# Patient Record
Sex: Female | Born: 1935 | Race: White | Hispanic: No | Smoking: Never smoker
Health system: Southern US, Community
[De-identification: ages and names within clinical notes are randomized; demographics above are authoritative.]

## PROBLEM LIST (undated history)

## (undated) DIAGNOSIS — K219 Gastro-esophageal reflux disease without esophagitis: Secondary | ICD-10-CM

## (undated) DIAGNOSIS — E039 Hypothyroidism, unspecified: Secondary | ICD-10-CM

## (undated) DIAGNOSIS — H548 Legal blindness, as defined in USA: Secondary | ICD-10-CM

## (undated) DIAGNOSIS — H3530 Unspecified macular degeneration: Secondary | ICD-10-CM

## (undated) DIAGNOSIS — C801 Malignant (primary) neoplasm, unspecified: Secondary | ICD-10-CM

## (undated) DIAGNOSIS — M1990 Unspecified osteoarthritis, unspecified site: Secondary | ICD-10-CM

## (undated) DIAGNOSIS — K589 Irritable bowel syndrome without diarrhea: Secondary | ICD-10-CM

## (undated) DIAGNOSIS — E079 Disorder of thyroid, unspecified: Secondary | ICD-10-CM

## (undated) DIAGNOSIS — G250 Essential tremor: Secondary | ICD-10-CM

## (undated) DIAGNOSIS — E7800 Pure hypercholesterolemia, unspecified: Secondary | ICD-10-CM

## (undated) DIAGNOSIS — Z5189 Encounter for other specified aftercare: Secondary | ICD-10-CM

## (undated) DIAGNOSIS — E785 Hyperlipidemia, unspecified: Secondary | ICD-10-CM

## (undated) HISTORY — PX: ABDOMINAL HYSTERECTOMY: SHX81

## (undated) HISTORY — PX: EYE SURGERY: SHX253

## (undated) HISTORY — PX: OTHER SURGICAL HISTORY: SHX169

## (undated) HISTORY — PX: JOINT REPLACEMENT: SHX530

---

## 2017-05-03 ENCOUNTER — Encounter: Payer: Self-pay | Admitting: Emergency Medicine

## 2017-05-03 ENCOUNTER — Emergency Department
Admission: EM | Admit: 2017-05-03 | Discharge: 2017-05-03 | Disposition: A | Discharge status: Home / Self Care | Payer: Medicare Other | Attending: Emergency Medicine | Admitting: Emergency Medicine

## 2017-05-03 DIAGNOSIS — E079 Disorder of thyroid, unspecified: Secondary | ICD-10-CM | POA: Insufficient documentation

## 2017-05-03 DIAGNOSIS — K529 Noninfective gastroenteritis and colitis, unspecified: Secondary | ICD-10-CM | POA: Diagnosis not present

## 2017-05-03 DIAGNOSIS — R1110 Vomiting, unspecified: Secondary | ICD-10-CM | POA: Diagnosis present

## 2017-05-03 DIAGNOSIS — C44310 Basal cell carcinoma of skin of unspecified parts of face: Secondary | ICD-10-CM | POA: Diagnosis not present

## 2017-05-03 DIAGNOSIS — R1032 Left lower quadrant pain: Secondary | ICD-10-CM | POA: Insufficient documentation

## 2017-05-03 HISTORY — DX: Pure hypercholesterolemia, unspecified: E78.00

## 2017-05-03 HISTORY — DX: Malignant (primary) neoplasm, unspecified: C80.1

## 2017-05-03 HISTORY — DX: Disorder of thyroid, unspecified: E07.9

## 2017-05-03 HISTORY — DX: Essential tremor: G25.0

## 2017-05-03 HISTORY — DX: Legal blindness, as defined in USA: H54.8

## 2017-05-03 HISTORY — DX: Unspecified macular degeneration: H35.30

## 2017-05-03 LAB — CBC
HCT: 37.8 % (ref 35.0–47.0)
Hemoglobin: 13 g/dL (ref 12.0–16.0)
MCH: 32.1 pg (ref 26.0–34.0)
MCHC: 34.4 g/dL (ref 32.0–36.0)
MCV: 93.5 fL (ref 80.0–100.0)
PLATELETS: 178 10*3/uL (ref 150–440)
RBC: 4.04 MIL/uL (ref 3.80–5.20)
RDW: 12.7 % (ref 11.5–14.5)
WBC: 3.8 10*3/uL (ref 3.6–11.0)

## 2017-05-03 LAB — COMPREHENSIVE METABOLIC PANEL
ALK PHOS: 55 U/L (ref 38–126)
ALT: 16 U/L (ref 14–54)
AST: 24 U/L (ref 15–41)
Albumin: 4.6 g/dL (ref 3.5–5.0)
Anion gap: 8 (ref 5–15)
BILIRUBIN TOTAL: 0.9 mg/dL (ref 0.3–1.2)
BUN: 19 mg/dL (ref 6–20)
CALCIUM: 8.8 mg/dL — AB (ref 8.9–10.3)
CO2: 26 mmol/L (ref 22–32)
CREATININE: 1.16 mg/dL — AB (ref 0.44–1.00)
Chloride: 102 mmol/L (ref 101–111)
GFR calc Af Amer: 49 mL/min — ABNORMAL LOW (ref >60)
GFR, EST NON AFRICAN AMERICAN: 43 mL/min — AB (ref >60)
Glucose, Bld: 96 mg/dL (ref 65–99)
Potassium: 3.9 mmol/L (ref 3.5–5.1)
Sodium: 136 mmol/L (ref 135–145)
Total Protein: 7.4 g/dL (ref 6.5–8.1)

## 2017-05-03 LAB — LIPASE, BLOOD: Lipase: 22 U/L (ref 11–51)

## 2017-05-03 MED ORDER — DIPHENOXYLATE-ATROPINE 2.5-0.025 MG PO TABS: 1.0000 | ORAL_TABLET | Freq: Four times a day (QID) | ORAL | 1 refills | Status: AC | PRN

## 2017-05-03 MED ORDER — ONDANSETRON 4 MG PO TBDP: 4.0000 mg | ORAL_TABLET | Freq: Three times a day (TID) | ORAL | 0 refills | Status: DC | PRN

## 2017-05-03 MED ORDER — ONDANSETRON 4 MG PO TBDP
4.0000 mg | ORAL_TABLET | Freq: Once | ORAL | Status: AC
  Administered 2017-05-03: 4 mg via ORAL
  Filled 2017-05-03: qty 1

## 2017-05-03 NOTE — ED Provider Notes (Signed)
[facility] Emergency Department Provider Note       Time seen: ----------------------------------------- 4:11 PM on 05/03/2017 -----------------------------------------     I have reviewed the triage vital signs and the nursing notes.   HISTORY   Chief Complaint Emesis; Diarrhea; and Abdominal Pain (LLQ)    HPI Carla Warner is a 83 y.o. female who presents to the ED for vomiting and diarrhea yesterday and today. Patient is currently able to tolerate some fluids. Yesterday she states she had a fever but no fever today. She denies chest pain, shortness of breath, other complaints at this time. She denies any focal abdominal tenderness. She does have history of IBS which feels much differently than this.   Past Medical History:  Diagnosis Date  . Cancer ([place])    basal cell ca-- face  . Essential tremor   . High cholesterol   . Legally blind   . Macular degeneration   . Thyroid disease     There are no active problems to display for this patient.   Past Surgical History:  Procedure Laterality Date  . ABDOMINAL HYSTERECTOMY    . basal cell removed    . JOINT REPLACEMENT Right     Allergies Patient has no known allergies.  Social History Social History  Substance Use Topics  . Smoking status: Never Smoker  . Smokeless tobacco: Never Used  . Alcohol use No    Review of Systems Constitutional: Negative for fever. Cardiovascular: Negative for chest pain. Respiratory: Negative for shortness of breath. Gastrointestinal: Negative for abdominal pain, Positive for vomiting and diarrhea Genitourinary: Negative for dysuria. Musculoskeletal: Negative for back pain. Skin: Negative for rash. Neurological: Negative for headaches, focal weakness or numbness.  All systems negative/normal/unremarkable except as stated in the HPI  ____________________________________________   PHYSICAL EXAM:  VITAL SIGNS: ED Triage Vitals  Enc Vitals  Group     BP 05/03/17 1255 (!) 120/46     Pulse Rate 05/03/17 1255 70     Resp 05/03/17 1255 16     Temp 05/03/17 1255 98.5 F (36.9 C)     Temp Source 05/03/17 1255 Oral     SpO2 05/03/17 1255 98 %     Weight 05/03/17 1251 135 lb (61.2 kg)     Height 05/03/17 1251 5\' 6"  (1.676 m)     Head Circumference --      Peak Flow --      Pain Score 05/03/17 1251 5     Pain Loc --      Pain Edu? --      Excl. in [place]? --     Constitutional: Alert and oriented. Well appearing and in no distress. Eyes: Conjunctivae are normal. Normal extraocular movements. ENT   Head: Normocephalic and atraumatic.   Nose: No congestion/rhinnorhea.   Mouth/Throat: Mucous membranes are moist.   Neck: No stridor. Cardiovascular: Normal rate, regular rhythm. No murmurs, rubs, or gallops. Respiratory: Normal respiratory effort without tachypnea nor retractions. Breath sounds are clear and equal bilaterally. No wheezes/rales/rhonchi. Gastrointestinal: Soft and nontender. Normal bowel sounds Musculoskeletal: Nontender with normal range of motion in extremities. No lower extremity tenderness nor edema. Neurologic:  Normal speech and language. No gross focal neurologic deficits are appreciated.  Skin:  Skin is warm, dry and intact. No rash noted. Psychiatric: Mood and affect are normal. Speech and behavior are normal.  ____________________________________________  ED COURSE:  Pertinent labs & imaging results that were available during my care of the patient were reviewed by  me and considered in my medical decision making (see chart for details). Patient presents for symptoms of gastroenteritis, we will assess with labs and imaging as indicated.   Procedures ____________________________________________   LABS (pertinent positives/negatives)  Labs Reviewed  COMPREHENSIVE METABOLIC PANEL - Abnormal; Notable for the following:       Result Value   Creatinine, Ser 1.16 (*)    Calcium 8.8 (*)    GFR  calc non Af Amer 43 (*)    GFR calc Af Amer 49 (*)    All other components within normal limits  LIPASE, BLOOD  CBC  URINALYSIS, COMPLETE (UACMP) WITH MICROSCOPIC   ____________________________________________  FINAL ASSESSMENT AND PLAN  Gastroenteritis  Plan: Patient's labs were dictated above. Patient had presented for vomiting and diarrhea and appears much younger than her stated age. Her labs reveal mild dehydration but she was able to drink well here. I will prescribe antiemetics and antidiarrheal agents and should her symptoms recur. She is stable for outpatient follow-up.   Earleen Newport, MD   Note: This note was generated in part or whole with voice recognition software. Voice recognition is usually quite accurate but there are transcription errors that can and very often do occur. I apologize for any typographical errors that were not detected and corrected.     Earleen Newport, MD 05/03/17 [phone]

## 2017-05-03 NOTE — ED Triage Notes (Signed)
Arrives with c/o vomiting and diarrhea yesterday and today.  Today able to tolerate fluids.  Yesterday patient had low grade fever, no fever today.

## 2017-08-05 ENCOUNTER — Emergency Department
Admission: EM | Admit: 2017-08-05 | Discharge: 2017-08-05 | Disposition: A | Discharge status: Home / Self Care | Payer: Medicare Other | Attending: Emergency Medicine | Admitting: Emergency Medicine

## 2017-08-05 DIAGNOSIS — R197 Diarrhea, unspecified: Secondary | ICD-10-CM | POA: Insufficient documentation

## 2017-08-05 DIAGNOSIS — Z85828 Personal history of other malignant neoplasm of skin: Secondary | ICD-10-CM | POA: Insufficient documentation

## 2017-08-05 DIAGNOSIS — R112 Nausea with vomiting, unspecified: Secondary | ICD-10-CM

## 2017-08-05 DIAGNOSIS — I10 Essential (primary) hypertension: Secondary | ICD-10-CM | POA: Insufficient documentation

## 2017-08-05 LAB — COMPREHENSIVE METABOLIC PANEL
ALBUMIN: 5 g/dL (ref 3.5–5.0)
ALT: 15 U/L (ref 14–54)
ANION GAP: 10 (ref 5–15)
AST: 22 U/L (ref 15–41)
Alkaline Phosphatase: 48 U/L (ref 38–126)
BUN: 26 mg/dL — ABNORMAL HIGH (ref 6–20)
CHLORIDE: 106 mmol/L (ref 101–111)
CO2: 24 mmol/L (ref 22–32)
Calcium: 9.4 mg/dL (ref 8.9–10.3)
Creatinine, Ser: 1.16 mg/dL — ABNORMAL HIGH (ref 0.44–1.00)
GFR calc Af Amer: 49 mL/min — ABNORMAL LOW (ref >60)
GFR calc non Af Amer: 43 mL/min — ABNORMAL LOW (ref >60)
GLUCOSE: 150 mg/dL — AB (ref 65–99)
Potassium: 3.9 mmol/L (ref 3.5–5.1)
SODIUM: 140 mmol/L (ref 135–145)
Total Bilirubin: 0.9 mg/dL (ref 0.3–1.2)
Total Protein: 7.9 g/dL (ref 6.5–8.1)

## 2017-08-05 LAB — URINALYSIS, COMPLETE (UACMP) WITH MICROSCOPIC
BACTERIA UA: NONE SEEN
BILIRUBIN URINE: NEGATIVE
Glucose, UA: NEGATIVE mg/dL
Hgb urine dipstick: NEGATIVE
KETONES UR: 5 mg/dL — AB
Leukocytes, UA: NEGATIVE
Nitrite: NEGATIVE
PH: 5 (ref 5.0–8.0)
PROTEIN: NEGATIVE mg/dL
RBC / HPF: NONE SEEN RBC/hpf (ref 0–5)
Specific Gravity, Urine: 1.016 (ref 1.005–1.030)

## 2017-08-05 LAB — CBC
HEMATOCRIT: 40 % (ref 35.0–47.0)
HEMOGLOBIN: 13.6 g/dL (ref 12.0–16.0)
MCH: 32 pg (ref 26.0–34.0)
MCHC: 34.1 g/dL (ref 32.0–36.0)
MCV: 93.9 fL (ref 80.0–100.0)
Platelets: 193 10*3/uL (ref 150–440)
RBC: 4.26 MIL/uL (ref 3.80–5.20)
RDW: 12.6 % (ref 11.5–14.5)
WBC: 8.2 10*3/uL (ref 3.6–11.0)

## 2017-08-05 LAB — TROPONIN I: Troponin I: <0.03 ng/mL (ref <0.03)

## 2017-08-05 LAB — LIPASE, BLOOD: LIPASE: 31 U/L (ref 11–51)

## 2017-08-05 MED ORDER — SODIUM CHLORIDE 0.9 % IV BOLUS (SEPSIS)
1000.0000 mL | Freq: Once | INTRAVENOUS | Status: AC
  Administered 2017-08-05: 1000 mL via INTRAVENOUS

## 2017-08-05 MED ORDER — ONDANSETRON HCL 4 MG/2ML IJ SOLN
4.0000 mg | Freq: Once | INTRAMUSCULAR | Status: AC | PRN
  Administered 2017-08-05: 4 mg via INTRAVENOUS
  Filled 2017-08-05: qty 2

## 2017-08-05 MED ORDER — ONDANSETRON 4 MG PO TBDP: 4.0000 mg | ORAL_TABLET | Freq: Three times a day (TID) | ORAL | 0 refills | Status: DC | PRN

## 2017-08-05 NOTE — ED Triage Notes (Addendum)
Pt arrives to ED via [facility] from home with c/o N/V/D that started around 130 am. Pt denies CP, abdominal pain, or SHOB. Pt with h/x of IBS, reports 4-5 episodes of non-bloody emesis and 6-7 episodes of diarrhea. Pt had facial surgery on Wednesday at [facility] to remove cancerous tumor and scheduled for follow-up appt this AM. Family worried about dehydration and seeks to have pt evaluated. EMS vitals: 130/56 BP, 82 HR, 99% O2 on RA. Pt is A&O, in NAD; RR even, regular, and unlabored; skin color/temp is WNL. EMS reports pt took 2 doses of prescribed Hydrocodone/APAP: (1) at 530pm and (1) at 11pm.

## 2017-08-05 NOTE — ED Provider Notes (Signed)
Care signed over from Dr. Beather Arbour pending results of urinalysis and stool studies. Patient's urinalysis is negative. She has been unable to give a stool sample. She feels like this is an exacerbation of her chronic IBS. She is able to eat and drink. She would like to go home.  Strict return precautions given.   Darel Hong, MD 08/05/17 [phone]

## 2017-08-05 NOTE — Discharge Instructions (Signed)
Please follow-up with your primary care physician as needed and return to the emergency department for any concerns whatsoever.  It was a pleasure to take care of you today, and thank you for coming to our emergency department.  If you have any questions or concerns before leaving please ask the nurse to grab me and I'm more than happy to go through your aftercare instructions again.  If you were prescribed any opioid pain medication today such as Norco, Vicodin, Percocet, morphine, hydrocodone, or oxycodone please make sure you do not drive when you are taking this medication as it can alter your ability to drive safely.  If you have any concerns once you are home that you are not improving or are in fact getting worse before you can make it to your follow-up appointment, please do not hesitate to call [phone] and come back for further evaluation.  Darel Hong, MD  Results for orders placed or performed during the hospital encounter of 08/05/17  Lipase, blood  Result Value Ref Range   Lipase 31 11 - 51 U/L  Comprehensive metabolic panel  Result Value Ref Range   Sodium 140 135 - 145 mmol/L   Potassium 3.9 3.5 - 5.1 mmol/L   Chloride 106 101 - 111 mmol/L   CO2 24 22 - 32 mmol/L   Glucose, Bld 150 (H) 65 - 99 mg/dL   BUN 26 (H) 6 - 20 mg/dL   Creatinine, Ser 1.16 (H) 0.44 - 1.00 mg/dL   Calcium 9.4 8.9 - 10.3 mg/dL   Total Protein 7.9 6.5 - 8.1 g/dL   Albumin 5.0 3.5 - 5.0 g/dL   AST 22 15 - 41 U/L   ALT 15 14 - 54 U/L   Alkaline Phosphatase 48 38 - 126 U/L   Total Bilirubin 0.9 0.3 - 1.2 mg/dL   GFR calc non Af Amer 43 (L) >60 mL/min   GFR calc Af Amer 49 (L) >60 mL/min   Anion gap 10 5 - 15  CBC  Result Value Ref Range   WBC 8.2 3.6 - 11.0 K/uL   RBC 4.26 3.80 - 5.20 MIL/uL   Hemoglobin 13.6 12.0 - 16.0 g/dL   HCT 40.0 35.0 - 47.0 %   MCV 93.9 80.0 - 100.0 fL   MCH 32.0 26.0 - 34.0 pg   MCHC 34.1 32.0 - 36.0 g/dL   RDW 12.6 11.5 - 14.5 %   Platelets 193 150 - 440 K/uL    Urinalysis, Complete w Microscopic  Result Value Ref Range   Color, Urine YELLOW (A) YELLOW   APPearance CLEAR (A) CLEAR   Specific Gravity, Urine 1.016 1.005 - 1.030   pH 5.0 5.0 - 8.0   Glucose, UA NEGATIVE NEGATIVE mg/dL   Hgb urine dipstick NEGATIVE NEGATIVE   Bilirubin Urine NEGATIVE NEGATIVE   Ketones, ur 5 (A) NEGATIVE mg/dL   Protein, ur NEGATIVE NEGATIVE mg/dL   Nitrite NEGATIVE NEGATIVE   Leukocytes, UA NEGATIVE NEGATIVE   RBC / HPF NONE SEEN 0 - 5 RBC/hpf   WBC, UA 0-5 0 - 5 WBC/hpf   Bacteria, UA NONE SEEN NONE SEEN   Squamous Epithelial / LPF 0-5 (A) NONE SEEN   Mucus PRESENT    Hyaline Casts, UA PRESENT   Troponin I  Result Value Ref Range   Troponin I <0.03 <0.03 ng/mL

## 2017-08-05 NOTE — ED Provider Notes (Signed)
[facility] Emergency Department Provider Note   ____________________________________________   First MD Initiated Contact with Patient 08/05/17 [phone]     (approximate)  I have reviewed the triage vital signs and the nursing notes.   HISTORY  Chief Complaint Nausea; Emesis; and Diarrhea    HPI Carla Warner is a 83 y.o. female brought to the ED from home via EMS with a chief complaint of nausea/vomiting/diarrhea.Patient had Mohs surgery to her nose and left ear at [facility] yesterday, discharged home on Vicodin. Took a dose at 5:30 PM and then again at 11 PM. Awoke at 1:30 AM with simultaneous vomiting and diarrhea. Patient has a history of IBS. Family concerned for dehydration. Also states the wound on her nose was saturated with blood while she was vomiting. States that seems to have resolved. Patient denies fever, chills, chest pain, shortness of breath, abdominal pain, dysuria. Denies recent travel, trauma or antibiotic use.   Past Medical History:  Diagnosis Date  . Cancer ([place])    basal cell ca-- face  . Essential tremor   . High cholesterol   . Legally blind   . Macular degeneration   . Thyroid disease     There are no active problems to display for this patient.   Past Surgical History:  Procedure Laterality Date  . ABDOMINAL HYSTERECTOMY    . basal cell removed    . JOINT REPLACEMENT Right     Prior to Admission medications   Medication Sig Start Date End Date Taking? Authorizing Provider  diphenoxylate-atropine (LOMOTIL) 2.5-0.025 MG tablet Take 1 tablet by mouth 4 (four) times daily as needed for diarrhea or loose stools. 05/03/17 05/03/18  Earleen Newport, MD  ondansetron (ZOFRAN ODT) 4 MG disintegrating tablet Take 1 tablet (4 mg total) by mouth every 8 (eight) hours as needed for nausea or vomiting. 05/03/17   Earleen Newport, MD    Allergies Patient has no known allergies.  No family history on file.  Social  History Social History  Substance Use Topics  . Smoking status: Never Smoker  . Smokeless tobacco: Never Used  . Alcohol use No    Review of Systems  Constitutional: No fever/chills. Eyes: No visual changes. ENT: No sore throat. Cardiovascular: Denies chest pain. Respiratory: Denies shortness of breath. Gastrointestinal: No abdominal pain.  Positive for nausea, vomiting and diarrhea.  No constipation. Genitourinary: Negative for dysuria. Musculoskeletal: Negative for back pain. Skin: Negative for rash. Neurological: Negative for headaches, focal weakness or numbness.   ____________________________________________   PHYSICAL EXAM:  VITAL SIGNS: ED Triage Vitals  Enc Vitals Group     BP 08/05/17 0428 117/68     Pulse Rate 08/05/17 0428 80     Resp 08/05/17 0428 17     Temp 08/05/17 0428 98 F (36.7 C)     Temp Source 08/05/17 0428 Oral     SpO2 08/05/17 0428 100 %     Weight 08/05/17 0428 136 lb (61.7 kg)     Height 08/05/17 0428 5\' 6"  (1.676 m)     Head Circumference --      Peak Flow --      Pain Score 08/05/17 0427 0     Pain Loc --      Pain Edu? --      Excl. in [place]? --     Constitutional: Alert and oriented. Well appearing and in no acute distress. Eyes: Conjunctivae are normal. PERRL. EOMI. Head: Atraumatic. Left ear dressing intact. Nose:  External dressing removed. There is no active bleeding through the tan adherent dressing which patient was instructed to keep on until next week.  Mouth/Throat: Mucous membranes are moist.  Oropharynx non-erythematous. Neck: No stridor.  Supple neck without meningismus. Cardiovascular: Normal rate, regular rhythm. Grossly normal heart sounds.  Good peripheral circulation. Respiratory: Normal respiratory effort.  No retractions. Lungs CTAB. Gastrointestinal: Soft and nontender to light and deep palpation. No distention. No abdominal bruits. No CVA tenderness. Musculoskeletal: No lower extremity tenderness nor edema.  No  joint effusions. Neurologic:  Normal speech and language. No gross focal neurologic deficits are appreciated. No gait instability. Skin:  Skin is warm, dry and intact. No rash noted. Psychiatric: Mood and affect are normal. Speech and behavior are normal.  ____________________________________________   LABS (all labs ordered are listed, but only abnormal results are displayed)  Labs Reviewed  COMPREHENSIVE METABOLIC PANEL - Abnormal; Notable for the following:       Result Value   Glucose, Bld 150 (*)    BUN 26 (*)    Creatinine, Ser 1.16 (*)    GFR calc non Af Amer 43 (*)    GFR calc Af Amer 49 (*)    All other components within normal limits  C DIFFICILE QUICK SCREEN W PCR REFLEX  GASTROINTESTINAL PANEL BY PCR, STOOL (REPLACES STOOL CULTURE)  LIPASE, BLOOD  CBC  TROPONIN I  URINALYSIS, COMPLETE (UACMP) WITH MICROSCOPIC   ____________________________________________  EKG  None ____________________________________________  RADIOLOGY  No results found.  ____________________________________________   PROCEDURES  Procedure(s) performed: None  Procedures  Critical Care performed: No  ____________________________________________   INITIAL IMPRESSION / ASSESSMENT AND PLAN / ED COURSE    83 year old female with IBS who presents with nausea/vomiting/diarrhea after Mohs surgery. Will obtain screening lab work and urinalysis, stool cultures. Initiate IV fluid resuscitation, antiemetic; will reassess.  Clinical Course as of Aug 05 702  Thu Aug 05, 2017  0702 Updated patient and family members of laboratory results. Nausea improved. Will attempt ice chips. Awaiting urine and stool specimens. Will administer second liter IV fluids. Care transferred to Dr. Mable Paris pending reassessment, results and disposition.  [JS]    Clinical Course User Index [JS] Paulette Blanch, MD     ____________________________________________   FINAL CLINICAL IMPRESSION(S) / ED  DIAGNOSES  Final diagnoses:  Nausea vomiting and diarrhea      NEW MEDICATIONS STARTED DURING THIS VISIT:  New Prescriptions   No medications on file     Note:  This document was prepared using Dragon voice recognition software and may include unintentional dictation errors.    Paulette Blanch, MD 08/05/17 [phone]

## 2017-10-28 ENCOUNTER — Other Ambulatory Visit: Payer: Self-pay | Admitting: Internal Medicine

## 2017-10-28 DIAGNOSIS — R1084 Generalized abdominal pain: Secondary | ICD-10-CM

## 2017-10-29 ENCOUNTER — Ambulatory Visit
Admission: RE | Admit: 2017-10-29 | Discharge: 2017-10-29 | Disposition: A | Discharge status: Home / Self Care | Payer: Medicare Other | Source: Ambulatory Visit | Attending: Internal Medicine | Admitting: Internal Medicine

## 2017-10-29 DIAGNOSIS — R195 Other fecal abnormalities: Secondary | ICD-10-CM | POA: Insufficient documentation

## 2017-10-29 DIAGNOSIS — Z9049 Acquired absence of other specified parts of digestive tract: Secondary | ICD-10-CM | POA: Diagnosis not present

## 2017-10-29 DIAGNOSIS — I700 Atherosclerosis of aorta: Secondary | ICD-10-CM | POA: Diagnosis not present

## 2017-10-29 DIAGNOSIS — D7389 Other diseases of spleen: Secondary | ICD-10-CM | POA: Diagnosis not present

## 2017-10-29 DIAGNOSIS — K7689 Other specified diseases of liver: Secondary | ICD-10-CM | POA: Diagnosis not present

## 2017-10-29 DIAGNOSIS — R1084 Generalized abdominal pain: Secondary | ICD-10-CM | POA: Diagnosis present

## 2017-10-29 DIAGNOSIS — K5790 Diverticulosis of intestine, part unspecified, without perforation or abscess without bleeding: Secondary | ICD-10-CM | POA: Insufficient documentation

## 2017-10-29 DIAGNOSIS — Z90710 Acquired absence of both cervix and uterus: Secondary | ICD-10-CM | POA: Insufficient documentation

## 2017-10-29 MED ORDER — IOPAMIDOL (ISOVUE-300) INJECTION 61%
100.0000 mL | Freq: Once | INTRAVENOUS | Status: AC | PRN
  Administered 2017-10-29: 100 mL via INTRAVENOUS

## 2018-01-30 ENCOUNTER — Other Ambulatory Visit
Admission: RE | Admit: 2018-01-30 | Discharge: 2018-01-30 | Disposition: A | Discharge status: Home / Self Care | Payer: Medicare Other | Source: Ambulatory Visit | Attending: Gastroenterology | Admitting: Gastroenterology

## 2018-01-30 DIAGNOSIS — R197 Diarrhea, unspecified: Secondary | ICD-10-CM | POA: Insufficient documentation

## 2018-01-30 LAB — C DIFFICILE QUICK SCREEN W PCR REFLEX
C DIFFICLE (CDIFF) ANTIGEN: NEGATIVE
C Diff interpretation: NOT DETECTED
C Diff toxin: NEGATIVE

## 2018-01-30 LAB — GASTROINTESTINAL PANEL BY PCR, STOOL (REPLACES STOOL CULTURE)

## 2018-02-08 ENCOUNTER — Encounter: Payer: Self-pay | Admitting: *Deleted

## 2018-02-09 ENCOUNTER — Ambulatory Visit
Admission: RE | Admit: 2018-02-09 | Discharge: 2018-02-09 | Disposition: A | Discharge status: Home / Self Care | Payer: Medicare Other | Source: Ambulatory Visit | Attending: Internal Medicine | Admitting: Internal Medicine

## 2018-02-09 ENCOUNTER — Encounter: Payer: Self-pay | Admitting: *Deleted

## 2018-02-09 ENCOUNTER — Ambulatory Visit: Payer: Medicare Other | Admitting: Anesthesiology

## 2018-02-09 ENCOUNTER — Encounter
Admission: RE | Disposition: A | Discharge status: Home / Self Care | Payer: Self-pay | Source: Ambulatory Visit | Attending: Internal Medicine

## 2018-02-09 DIAGNOSIS — Z85828 Personal history of other malignant neoplasm of skin: Secondary | ICD-10-CM | POA: Insufficient documentation

## 2018-02-09 DIAGNOSIS — K640 First degree hemorrhoids: Secondary | ICD-10-CM | POA: Insufficient documentation

## 2018-02-09 DIAGNOSIS — K621 Rectal polyp: Secondary | ICD-10-CM | POA: Insufficient documentation

## 2018-02-09 DIAGNOSIS — R1032 Left lower quadrant pain: Secondary | ICD-10-CM | POA: Diagnosis not present

## 2018-02-09 DIAGNOSIS — G250 Essential tremor: Secondary | ICD-10-CM | POA: Diagnosis not present

## 2018-02-09 DIAGNOSIS — Z79899 Other long term (current) drug therapy: Secondary | ICD-10-CM | POA: Insufficient documentation

## 2018-02-09 DIAGNOSIS — K219 Gastro-esophageal reflux disease without esophagitis: Secondary | ICD-10-CM | POA: Insufficient documentation

## 2018-02-09 DIAGNOSIS — E785 Hyperlipidemia, unspecified: Secondary | ICD-10-CM | POA: Diagnosis not present

## 2018-02-09 DIAGNOSIS — H548 Legal blindness, as defined in USA: Secondary | ICD-10-CM | POA: Diagnosis not present

## 2018-02-09 DIAGNOSIS — K5730 Diverticulosis of large intestine without perforation or abscess without bleeding: Secondary | ICD-10-CM | POA: Insufficient documentation

## 2018-02-09 DIAGNOSIS — K591 Functional diarrhea: Secondary | ICD-10-CM | POA: Insufficient documentation

## 2018-02-09 DIAGNOSIS — E039 Hypothyroidism, unspecified: Secondary | ICD-10-CM | POA: Diagnosis not present

## 2018-02-09 DIAGNOSIS — M1990 Unspecified osteoarthritis, unspecified site: Secondary | ICD-10-CM | POA: Insufficient documentation

## 2018-02-09 HISTORY — DX: Irritable bowel syndrome without diarrhea: K58.9

## 2018-02-09 HISTORY — DX: Unspecified osteoarthritis, unspecified site: M19.90

## 2018-02-09 HISTORY — DX: Gastro-esophageal reflux disease without esophagitis: K21.9

## 2018-02-09 HISTORY — DX: Hypothyroidism, unspecified: E03.9

## 2018-02-09 HISTORY — PX: COLONOSCOPY WITH PROPOFOL: SHX5780

## 2018-02-09 HISTORY — DX: Encounter for other specified aftercare: Z51.89

## 2018-02-09 HISTORY — DX: Hyperlipidemia, unspecified: E78.5

## 2018-02-09 SURGERY — COLONOSCOPY WITH PROPOFOL
Anesthesia: General

## 2018-02-09 MED ORDER — PROPOFOL 500 MG/50ML IV EMUL
INTRAVENOUS | Status: DC | PRN
  Administered 2018-02-09: 120 ug/kg/min via INTRAVENOUS

## 2018-02-09 MED ORDER — PROPOFOL 500 MG/50ML IV EMUL
INTRAVENOUS | Status: AC
  Filled 2018-02-09: qty 50

## 2018-02-09 MED ORDER — PROPOFOL 10 MG/ML IV BOLUS
INTRAVENOUS | Status: DC | PRN
  Administered 2018-02-09: 40 mg via INTRAVENOUS

## 2018-02-09 MED ORDER — LIDOCAINE HCL (CARDIAC) 20 MG/ML IV SOLN
INTRAVENOUS | Status: DC | PRN
  Administered 2018-02-09: 60 mg via INTRAVENOUS

## 2018-02-09 MED ORDER — SODIUM CHLORIDE 0.9 % IV SOLN
INTRAVENOUS | Status: DC
  Administered 2018-02-09: 13:00:00 via INTRAVENOUS

## 2018-02-09 NOTE — Anesthesia Post-op Follow-up Note (Signed)
Anesthesia QCDR form completed.        

## 2018-02-09 NOTE — Transfer of Care (Signed)
Immediate Anesthesia Transfer of Care Note  Patient: Carla Warner  Procedure(s) Performed: COLONOSCOPY WITH PROPOFOL (N/A )  Patient Location: Endoscopy Unit  Anesthesia Type:General  Level of Consciousness: drowsy and patient cooperative  Airway & Oxygen Therapy: Patient Spontanous Breathing and Patient connected to nasal cannula oxygen  Post-op Assessment: Report given to RN, Post -op Vital signs reviewed and stable and Patient moving all extremities X 4  Post vital signs: Reviewed and stable  Last Vitals:  Vitals Value Taken Time  BP    Temp    Pulse    Resp    SpO2      Last Pain:  Vitals:   02/09/18 1213  TempSrc: Tympanic  PainSc: 5          Complications: No apparent anesthesia complications

## 2018-02-09 NOTE — Interval H&P Note (Signed)
History and Physical Interval Note:  02/09/2018 12:25 PM  Carla Warner  has presented today for surgery, with the diagnosis of DIARRHEA LLQ PAIN  The various methods of treatment have been discussed with the patient and family. After consideration of risks, benefits and other options for treatment, the patient has consented to  Procedure(s): COLONOSCOPY WITH PROPOFOL (N/A) as a surgical intervention .  The patient's history has been reviewed, patient examined, no change in status, stable for surgery.  I have reviewed the patient's chart and labs.  Questions were answered to the patient's satisfaction.     [place], [place]

## 2018-02-09 NOTE — Anesthesia Preprocedure Evaluation (Addendum)
Anesthesia Evaluation  Patient identified by MRN, date of birth, ID band Patient awake    Reviewed: Allergy & Precautions, H&P , NPO status , reviewed documented beta blocker date and time   Airway Mallampati: II  TM Distance: >3 FB Neck ROM: full    Dental  (+) Teeth Intact   Pulmonary    Pulmonary exam normal        Cardiovascular Normal cardiovascular exam     Neuro/Psych    GI/Hepatic GERD  ,  Endo/Other  Hypothyroidism   Renal/GU      Musculoskeletal  (+) Arthritis ,   Abdominal   Peds  Hematology   Anesthesia Other Findings Familial tremor  IBS (irritable bowel syndrome)  Seasonal allergic rhinitis  Macular degeneration  HLD (hyperlipidemia), unspecified  Hypothyroid, unspecified  Reproductive/Obstetrics                            Anesthesia Physical Anesthesia Plan  ASA: II  Anesthesia Plan: General   Post-op Pain Management:    Induction:   PONV Risk Score and Plan: 3 and Propofol infusion  Airway Management Planned:   Additional Equipment:   Intra-op Plan:   Post-operative Plan:   Informed Consent: I have reviewed the patients History and Physical, chart, labs and discussed the procedure including the risks, benefits and alternatives for the proposed anesthesia with the patient or authorized representative who has indicated his/her understanding and acceptance.   Dental Advisory Given  Plan Discussed with: CRNA  Anesthesia Plan Comments:        Anesthesia Quick Evaluation

## 2018-02-09 NOTE — H&P (Signed)
Outpatient short stay form Pre-procedure 02/09/2018 12:19 PM Carla Warner K. Carla Warner, M.D.  Primary Physician: Carla Warner, M.D.  Reason for visit:  Diarrhea, LLQ pain, Bowel habit changes  History of present illness: 84 year old female with a history of arthritis, GERD, irritable bowel syndrome and hyperlipidemia presents for persistent left lower quadrant pain as well as diarrhea not responding to treatment for empiric postobstructive symptoms.  Patient underwent a CT scan showing diverticulosis without diverticulitis. Last colonoscopy reportedly performed agree with [facility] revealed no polyps per patient history.  Patient scheduled for colonoscopy with biopsy to rule out microscopic colitis or previously undiagnosed infection.   No current facility-administered medications for this encounter.   Medications Prior to Admission  Medication Sig Dispense Refill Last Dose  . atorvastatin (LIPITOR) 20 MG tablet Take 20 mg by mouth daily.   02/06/2018  . levothyroxine (SYNTHROID, LEVOTHROID) 25 MCG tablet Take 25 mcg by mouth daily before breakfast.   02/06/2018  . propranolol (INDERAL) 40 MG tablet Take 40 mg by mouth 2 (two) times daily.   02/06/2018  . cetirizine (ZYRTEC) 10 MG tablet Take 10 mg by mouth daily.   Not Taking at Unknown time  . Cholecalciferol 1000 units TBDP Take 1,000 Units by mouth daily.   Not Taking at Unknown time  . dicyclomine (BENTYL) 10 MG capsule Take 10 mg by mouth 4 (four) times daily -  before meals and at bedtime.   Not Taking at Unknown time  . diphenoxylate-atropine (LOMOTIL) 2.5-0.025 MG tablet Take 1 tablet by mouth 4 (four) times daily as needed for diarrhea or loose stools. (Patient not taking: Reported on 02/09/2018) 30 tablet 1 Not Taking at Unknown time  . ketorolac (ACULAR) 0.5 % ophthalmic solution 1 drop 3 (three) times daily.   Not Taking at Unknown time  . omeprazole (PRILOSEC) 40 MG capsule Take 40 mg by mouth daily.   Not Taking at Unknown time  .  ondansetron (ZOFRAN ODT) 4 MG disintegrating tablet Take 1 tablet (4 mg total) by mouth every 8 (eight) hours as needed for nausea or vomiting. (Patient not taking: Reported on 02/09/2018) 20 tablet 0 Not Taking at Unknown time  . prednisoLONE acetate (PRED FORTE) 1 % ophthalmic suspension 1 drop 3 (three) times daily.   Not Taking at Unknown time     Allergies  Allergen Reactions  . Adhesive [Tape]      Past Medical History:  Diagnosis Date  . Arthritis   . Cancer ([place])    basal cell ca-- face  . Encounter for blood transfusion   . Essential tremor   . GERD (gastroesophageal reflux disease)   . High cholesterol   . HLD (hyperlipidemia)   . Hypothyroidism   . IBS (irritable bowel syndrome)   . Legally blind   . Macular degeneration   . Thyroid disease     Review of systems:   Otherwise negative.   Physical Exam  Gen: Alert, oriented. Appears stated age.  HEENT: [place]/AT. PERRLA. Lungs: CTA, no wheezes. CV: RR nl S1, S2. Abd: soft, benign, no masses. BS+ Ext: No edema. Pulses 2+    Planned procedures: Colonoscopy.The patient understands the nature of the planned procedure, indications, risks, alternatives and potential complications including but not limited to bleeding, infection, perforation, damage to internal organs and possible oversedation/side effects from anesthesia. The patient agrees and gives consent to proceed.  Please refer to procedure notes for findings, recommendations and patient disposition/instructions.    Carla Warner K. Carla Warner, M.D. Gastroenterology 02/09/2018  12:19 PM

## 2018-02-09 NOTE — Op Note (Signed)
[facility] Patient Name: Carla Warner Procedure Date: 02/09/2018 12:48 PM MRN: [id number] Account #: [id number] Date of Birth: 10/05/1933 Admit Type: Outpatient Age: 84 Room: [facility] ENDO ROOM 4 Gender: Female Note Status: Finalized Procedure:            Colonoscopy Indications:          Abdominal pain in the left lower quadrant, Functional                        diarrhea, Change in bowel habits Providers:            Benay Pike. [name] MD, MD Referring MD:         Leonie Douglas. Doy Hutching, MD (Referring MD) Medicines:            Propofol per Anesthesia Complications:        No immediate complications. Procedure:            Pre-Anesthesia Assessment:                       - Prior to the procedure, a History and Physical was                        performed, and patient medications, allergies and                        sensitivities were reviewed. The patient's tolerance of                        previous anesthesia was reviewed.                       - The risks and benefits of the procedure and the                        sedation options and risks were discussed with the                        patient. All questions were answered and informed                        consent was obtained.                       - Patient identification and proposed procedure were                        verified prior to the procedure by the nurse. The                        procedure was verified in the procedure room.                       - ASA Grade Assessment: II - A patient with mild                        systemic disease.                       - After reviewing the risks and benefits, the patient  was deemed in satisfactory condition to undergo the                        procedure.                       After obtaining informed consent, the colonoscope was                        passed under direct vision. Throughout the procedure,               the patient's blood pressure, pulse, and oxygen                        saturations were monitored continuously. The                        Colonoscope was introduced through the anus and                        advanced to the the cecum, identified by appendiceal                        orifice and ileocecal valve. The colonoscopy was                        performed without difficulty. The patient tolerated the                        procedure well. The quality of the bowel preparation                        was excellent. The ileocecal valve, appendiceal                        orifice, and rectum were photographed. Findings:      The perianal and digital rectal examinations were normal. Pertinent       negatives include normal sphincter tone and no palpable rectal lesions.      Many small-mouthed diverticula were found in the sigmoid colon.      Two sessile polyps were found in the rectum. The polyps were 2 to 3 mm       in size. These polyps were removed with a jumbo cold forceps. Resection       and retrieval were complete.      The exam was otherwise without abnormality.      Normal mucosa was found in the entire colon. Biopsies for histology were       taken with a cold forceps from the right colon and left colon for       evaluation of microscopic colitis.      Non-bleeding internal hemorrhoids were found during retroflexion. The       hemorrhoids were Grade I (internal hemorrhoids that do not prolapse).      The exam was otherwise without abnormality. Impression:           - Diverticulosis in the sigmoid colon.                       - Two 2 to 3 mm polyps in the rectum, removed with a  jumbo cold forceps. Resected and retrieved.                       - The examination was otherwise normal.                       - Normal mucosa in the entire examined colon. Biopsied.                       - Non-bleeding internal hemorrhoids.                       -  The examination was otherwise normal. Recommendation:       - Patient has a contact number available for                        emergencies. The signs and symptoms of potential                        delayed complications were discussed with the patient.                        Return to normal activities tomorrow. Written discharge                        instructions were provided to the patient.                       - Resume previous diet.                       - Continue present medications.                       - Await pathology results.                       - No repeat colonoscopy due to age.                       - Return to nurse practitioner in 6 weeks.                       - The findings and recommendations were discussed with                        the patient and their family. Procedure Code(s):    --- Professional ---                       [phone], Colonoscopy, flexible; with biopsy, single or                        multiple Diagnosis Code(s):    --- Professional ---                       K64.0, First degree hemorrhoids                       K62.1, Rectal polyp                       R10.32, Left lower quadrant pain  K59.1, Functional diarrhea                       R19.4, Change in bowel habit                       K57.30, Diverticulosis of large intestine without                        perforation or abscess without bleeding CPT copyright 2017 American Medical Association. All rights reserved. The codes documented in this report are preliminary and upon coder review may  be revised to meet current compliance requirements. Efrain Sella MD, MD 02/09/2018 1:14:09 PM This report has been signed electronically. Number of Addenda: 0 Note Initiated On: 02/09/2018 12:48 PM Scope Withdrawal Time: 0 hours 4 minutes 57 seconds  Total Procedure Duration: 0 hours 12 minutes 43 seconds       [facility]

## 2018-02-10 ENCOUNTER — Encounter: Payer: Self-pay | Admitting: Internal Medicine

## 2018-02-14 LAB — SURGICAL PATHOLOGY

## 2018-02-15 ENCOUNTER — Emergency Department: Payer: Medicare Other

## 2018-02-15 ENCOUNTER — Other Ambulatory Visit: Payer: Self-pay

## 2018-02-15 ENCOUNTER — Encounter: Payer: Self-pay | Admitting: Emergency Medicine

## 2018-02-15 ENCOUNTER — Emergency Department
Admission: EM | Admit: 2018-02-15 | Discharge: 2018-02-15 | Disposition: A | Discharge status: Home / Self Care | Payer: Medicare Other | Attending: Emergency Medicine | Admitting: Emergency Medicine

## 2018-02-15 DIAGNOSIS — S82831A Other fracture of upper and lower end of right fibula, initial encounter for closed fracture: Secondary | ICD-10-CM | POA: Insufficient documentation

## 2018-02-15 DIAGNOSIS — W010XXA Fall on same level from slipping, tripping and stumbling without subsequent striking against object, initial encounter: Secondary | ICD-10-CM | POA: Diagnosis not present

## 2018-02-15 DIAGNOSIS — Z79899 Other long term (current) drug therapy: Secondary | ICD-10-CM | POA: Diagnosis not present

## 2018-02-15 DIAGNOSIS — S99911A Unspecified injury of right ankle, initial encounter: Secondary | ICD-10-CM | POA: Diagnosis present

## 2018-02-15 DIAGNOSIS — S0990XA Unspecified injury of head, initial encounter: Secondary | ICD-10-CM | POA: Insufficient documentation

## 2018-02-15 DIAGNOSIS — Y999 Unspecified external cause status: Secondary | ICD-10-CM | POA: Insufficient documentation

## 2018-02-15 DIAGNOSIS — Y929 Unspecified place or not applicable: Secondary | ICD-10-CM | POA: Diagnosis not present

## 2018-02-15 DIAGNOSIS — Y939 Activity, unspecified: Secondary | ICD-10-CM | POA: Diagnosis not present

## 2018-02-15 DIAGNOSIS — E039 Hypothyroidism, unspecified: Secondary | ICD-10-CM | POA: Diagnosis not present

## 2018-02-15 MED ORDER — FREE SPIRIT KNEE/LEG WALKER MISC: 1.0000 | Freq: Once | 0 refills | Status: AC

## 2018-02-15 MED ORDER — OXYCODONE-ACETAMINOPHEN 5-325 MG PO TABS
1.0000 | ORAL_TABLET | Freq: Once | ORAL | Status: AC
  Administered 2018-02-15: 1 via ORAL
  Filled 2018-02-15: qty 1

## 2018-02-15 MED ORDER — OXYCODONE-ACETAMINOPHEN 5-325 MG PO TABS: 1.0000 | ORAL_TABLET | Freq: Four times a day (QID) | ORAL | 0 refills | Status: AC | PRN

## 2018-02-15 MED ORDER — ONDANSETRON 8 MG PO TBDP
8.0000 mg | ORAL_TABLET | Freq: Once | ORAL | Status: AC
  Administered 2018-02-15: 8 mg via ORAL
  Filled 2018-02-15: qty 1

## 2018-02-15 NOTE — ED Provider Notes (Signed)
[facility] Emergency Department Provider Note  ____________________________________________  Time seen: Approximately 11:14 PM  I have reviewed the triage vital signs and the nursing notes.   HISTORY  Chief Complaint Ankle Pain    HPI Carla Warner is a 84 y.o. female presents to the emergency department with 10 out of 10 throbbing right ankle pain after patient reports that she fell after standing up from the table.  Patient reports that she "does not remember why I fell".  Patient reports that she hit her chin but does not recall losing consciousness.  Patient reports that she called her son from the floor.  Patient has a history of macular degeneration but reports no acute changes in her vision.  Patient son conveys that patient is alert and oriented in the emergency department.  She has exhibited no signs of confusion or disorientation.  Patient has not been able to ambulate since the incident.  Patient lives independently by herself but her family lives approximately 15 minutes away.  She denies chest pain, chest tightness, shortness of breath, nausea, vomiting abdominal pain.   Past Medical History:  Diagnosis Date  . Arthritis   . Cancer ([place])    basal cell ca-- face  . Encounter for blood transfusion   . Essential tremor   . GERD (gastroesophageal reflux disease)   . High cholesterol   . HLD (hyperlipidemia)   . Hypothyroidism   . IBS (irritable bowel syndrome)   . Legally blind   . Macular degeneration   . Thyroid disease     There are no active problems to display for this patient.   Past Surgical History:  Procedure Laterality Date  . ABDOMINAL HYSTERECTOMY    . basal cell removed    . COLONOSCOPY WITH PROPOFOL N/A 02/09/2018   Procedure: COLONOSCOPY WITH PROPOFOL;  Surgeon: Toledo, Benay Pike, MD;  Location: [facility] ENDOSCOPY;  Service: Gastroenterology;  Laterality: N/A;  . EYE SURGERY     cataract extracapsular  . JOINT REPLACEMENT  Right     Prior to Admission medications   Medication Sig Start Date End Date Taking? Authorizing Provider  atorvastatin (LIPITOR) 20 MG tablet Take 20 mg by mouth daily.    [provider]  cetirizine (ZYRTEC) 10 MG tablet Take 10 mg by mouth daily.    [provider]  Cholecalciferol 1000 units TBDP Take 1,000 Units by mouth daily.    [provider]  dicyclomine (BENTYL) 10 MG capsule Take 10 mg by mouth 4 (four) times daily -  before meals and at bedtime.    [provider]  diphenoxylate-atropine (LOMOTIL) 2.5-0.025 MG tablet Take 1 tablet by mouth 4 (four) times daily as needed for diarrhea or loose stools. Patient not taking: Reported on 02/09/2018 05/03/17 05/03/18  Earleen Newport, MD  ketorolac (ACULAR) 0.5 % ophthalmic solution 1 drop 3 (three) times daily.    [provider]  levothyroxine (SYNTHROID, LEVOTHROID) 25 MCG tablet Take 25 mcg by mouth daily before breakfast.    [provider]  Misc. Devices (FREE SPIRIT KNEE/LEG WALKER) MISC 1 Device by Does not apply route once for 1 dose. 02/15/18 02/15/18  Lannie Fields, PA-C  omeprazole (PRILOSEC) 40 MG capsule Take 40 mg by mouth daily.    [provider]  ondansetron (ZOFRAN ODT) 4 MG disintegrating tablet Take 1 tablet (4 mg total) by mouth every 8 (eight) hours as needed for nausea or vomiting. Patient not taking: Reported on 02/09/2018 08/05/17  Darel Hong, MD  oxyCODONE-acetaminophen (PERCOCET/ROXICET) 5-325 MG tablet Take 1 tablet by mouth every 6 (six) hours as needed for up to 5 days for severe pain. 02/15/18 02/20/18  Lannie Fields, PA-C  prednisoLONE acetate (PRED FORTE) 1 % ophthalmic suspension 1 drop 3 (three) times daily.    [provider]  propranolol (INDERAL) 40 MG tablet Take 40 mg by mouth 2 (two) times daily.    [provider]    Allergies Adhesive [tape]  No family history on file.  Social History Social History    Tobacco Use  . Smoking status: Never Smoker  . Smokeless tobacco: Never Used  Substance Use Topics  . Alcohol use: Yes    Comment: 1 glass of wine 2-3 times per  . Drug use: No     Review of Systems  Constitutional: No fever/chills Eyes: No visual changes. No discharge ENT: No upper respiratory complaints. Cardiovascular: no chest pain. Respiratory: no cough. No SOB. Gastrointestinal: No abdominal pain.  No nausea, no vomiting.  No diarrhea.  No constipation. Genitourinary: Negative for dysuria. No hematuria Musculoskeletal: Patient has right ankle pain. Skin: Negative for rash, abrasions, lacerations, ecchymosis. Neurological: Negative for headaches, focal weakness or numbness.   ____________________________________________   PHYSICAL EXAM:  VITAL SIGNS: ED Triage Vitals  Enc Vitals Group     BP 02/15/18 2111 (!) 114/50     Pulse Rate 02/15/18 2111 79     Resp 02/15/18 2111 18     Temp 02/15/18 2111 98.2 F (36.8 C)     Temp Source 02/15/18 2111 Oral     SpO2 02/15/18 2111 100 %     Weight 02/15/18 2112 130 lb (59 kg)     Height 02/15/18 2112 5\' 6"  (1.676 m)     Head Circumference --      Peak Flow --      Pain Score 02/15/18 2112 8     Pain Loc --      Pain Edu? --      Excl. in [place]? --      Constitutional: Alert and oriented. Well appearing and in no acute distress. Eyes: Conjunctivae are normal. PERRL. EOMI. Head: Atraumatic. ENT:      Ears: TMs are pearly.      Nose: No congestion/rhinnorhea.      Mouth/Throat: Mucous membranes are moist.  Neck: No stridor.  No cervical spine tenderness to palpation. Cardiovascular: Normal rate, regular rhythm. Normal S1 and S2.  Good peripheral circulation. Respiratory: Normal respiratory effort without tachypnea or retractions. Lungs CTAB. Good air entry to the bases with no decreased or absent breath sounds. Musculoskeletal:.  No tenderness is elicited over the right metatarsals.  Significant tenderness is  elicited with palpation over the right lateral malleolus but not the proximal fibula. Patient demonstrates limited range of active motion of the right ankle, likely secondary to pain.  She is able to move all 5 right toes.  Palpable dorsalis pedis pulse, right. Neurologic:  Normal speech and language. No gross focal neurologic deficits are appreciated.  Skin:  Skin is warm, dry and intact. No rash noted.  ____________________________________________   LABS (all labs ordered are listed, but only abnormal results are displayed)  Labs Reviewed - No data to display ____________________________________________  EKG   ____________________________________________  RADIOLOGY Unk Pinto, personally viewed and evaluated these images (plain radiographs) as part of my medical decision making, as well as reviewing the written report by the radiologist.  Dg Ankle Complete  Right  Result Date: 02/15/2018 CLINICAL DATA:  Fall, ankle injury EXAM: RIGHT ANKLE - COMPLETE 3+ VIEW COMPARISON:  None. FINDINGS: Subtle minimally displaced fracture of the lateral malleolus at the tip of the fibula. Ankle mortise intact. Talar dome normal. Intense soft tissue swelling over the fractured lateral malleolus. IMPRESSION: Fracture of the lateral malleolus with soft tissue swelling. Electronically Signed   By: Suzy Bouchard M.D.   On: 02/15/2018 21:52   Ct Head Wo Contrast  Result Date: 02/15/2018 CLINICAL DATA:  Fall EXAM: CT HEAD WITHOUT CONTRAST TECHNIQUE: Contiguous axial images were obtained from the base of the skull through the vertex without intravenous contrast. COMPARISON:  None. FINDINGS: Brain: No acute territorial infarction, hemorrhage or intracranial mass is visualized. Mild atrophy. Minimal small vessel ischemic changes of the white matter. Normal ventricle size. Vascular: No hyperdense vessels.  Carotid vascular calcification Skull: Normal. Negative for fracture or focal lesion. Sinuses/Orbits:  Mild mucosal thickening in the ethmoid sinuses. No acute orbital abnormality Other: None IMPRESSION: 1. No CT evidence for acute intracranial abnormality. 2. Atrophy and mild small vessel ischemic changes of white matter Electronically Signed   By: Donavan Foil M.D.   On: 02/15/2018 23:23    ____________________________________________    PROCEDURES  Procedure(s) performed:    Procedures    Medications  oxyCODONE-acetaminophen (PERCOCET/ROXICET) 5-325 MG per tablet 1 tablet (1 tablet Oral Given 02/15/18 2220)  ondansetron (ZOFRAN-ODT) disintegrating tablet 8 mg (8 mg Oral Given 02/15/18 2220)     ____________________________________________   INITIAL IMPRESSION / ASSESSMENT AND PLAN / ED COURSE  Pertinent labs & imaging results that were available during my care of the patient were reviewed by me and considered in my medical decision making (see chart for details).  Review of the [place] CSRS was performed in accordance of the [place] prior to dispensing any controlled drugs.    Assessment and plan Right ankle pain Patient presents to the emergency department with right ankle pain after a fall.  Differential diagnosis included fracture versus sprain versus subdural hematoma.  X-ray examination reveals a nondisplaced lateral malleolus fracture.  Patient was given Roxicet in the emergency department for pain and underwent splinting.  She was referred to orthopedics, Dr. Sabra Heck.  CT head was also obtained and reveals no acute abnormality.     ____________________________________________  FINAL CLINICAL IMPRESSION(S) / ED DIAGNOSES  Final diagnoses:  Other closed fracture of distal end of right fibula, initial encounter      NEW MEDICATIONS STARTED DURING THIS VISIT:  ED Discharge Orders        Ordered    oxyCODONE-acetaminophen (PERCOCET/ROXICET) 5-325 MG tablet  Every 6 hours PRN     02/15/18 2342    DME Bedside commode     02/15/18 2343    Walker standard     02/15/18  2343    Misc. Devices (FREE SPIRIT KNEE/LEG WALKER) [place]   Once     02/15/18 2343          This chart was dictated using voice recognition software/Dragon. Despite best efforts to proofread, errors can occur which can change the meaning. Any change was purely unintentional.    Lannie Fields, PA-C 02/15/18 2351    Schuyler Amor, MD 02/17/18 [phone]

## 2018-02-15 NOTE — ED Triage Notes (Signed)
Pt to triage via w/c with no distress noted; reports falling when getting up from chair injuring right ankle; swelling noted; st some soreness to chin as well

## 2018-02-17 DIAGNOSIS — S8263XA Displaced fracture of lateral malleolus of unspecified fibula, initial encounter for closed fracture: Secondary | ICD-10-CM | POA: Insufficient documentation

## 2018-02-17 DIAGNOSIS — S8000XA Contusion of unspecified knee, initial encounter: Secondary | ICD-10-CM | POA: Insufficient documentation

## 2018-03-01 NOTE — Anesthesia Postprocedure Evaluation (Signed)
Anesthesia Post Note  Patient: Carla Warner  Procedure(s) Performed: COLONOSCOPY WITH PROPOFOL (N/A )  Patient location during evaluation: Endoscopy Anesthesia Type: General Level of consciousness: awake and alert Pain management: pain level controlled Vital Signs Assessment: post-procedure vital signs reviewed and stable Respiratory status: spontaneous breathing, nonlabored ventilation and respiratory function stable Cardiovascular status: blood pressure returned to baseline and stable Postop Assessment: no apparent nausea or vomiting Anesthetic complications: no     Last Vitals:  Vitals:   02/09/18 1330 02/09/18 1350  BP: 110/64 124/60  Pulse:    Resp:    Temp:    SpO2:      Last Pain:  Vitals:   02/09/18 1350  TempSrc:   PainSc: 0-No pain                 Alphonsus Sias

## 2018-04-11 ENCOUNTER — Ambulatory Visit: Admit: 2018-04-11 | Payer: Medicare Other | Admitting: Internal Medicine

## 2018-04-11 SURGERY — COLONOSCOPY WITH PROPOFOL
Anesthesia: General

## 2018-05-19 ENCOUNTER — Ambulatory Visit (INDEPENDENT_AMBULATORY_CARE_PROVIDER_SITE_OTHER): Payer: Medicare Other | Admitting: Obstetrics and Gynecology

## 2018-05-19 ENCOUNTER — Encounter: Payer: Self-pay | Admitting: Obstetrics and Gynecology

## 2018-05-19 ENCOUNTER — Ambulatory Visit (INDEPENDENT_AMBULATORY_CARE_PROVIDER_SITE_OTHER): Payer: Medicare Other

## 2018-05-19 VITALS — BP 129/76 | HR 67 | Ht 66.0 in | Wt 127.2 lb

## 2018-05-19 DIAGNOSIS — E039 Hypothyroidism, unspecified: Secondary | ICD-10-CM | POA: Insufficient documentation

## 2018-05-19 DIAGNOSIS — F419 Anxiety disorder, unspecified: Secondary | ICD-10-CM | POA: Insufficient documentation

## 2018-05-19 DIAGNOSIS — Z8719 Personal history of other diseases of the digestive system: Secondary | ICD-10-CM | POA: Diagnosis not present

## 2018-05-19 DIAGNOSIS — K589 Irritable bowel syndrome without diarrhea: Secondary | ICD-10-CM | POA: Insufficient documentation

## 2018-05-19 DIAGNOSIS — R634 Abnormal weight loss: Secondary | ICD-10-CM | POA: Insufficient documentation

## 2018-05-19 DIAGNOSIS — F32A Depression, unspecified: Secondary | ICD-10-CM

## 2018-05-19 DIAGNOSIS — R1032 Left lower quadrant pain: Secondary | ICD-10-CM | POA: Diagnosis not present

## 2018-05-19 DIAGNOSIS — F329 Major depressive disorder, single episode, unspecified: Secondary | ICD-10-CM

## 2018-05-19 DIAGNOSIS — F4329 Adjustment disorder with other symptoms: Secondary | ICD-10-CM

## 2018-05-19 DIAGNOSIS — Z90722 Acquired absence of ovaries, bilateral: Secondary | ICD-10-CM

## 2018-05-19 DIAGNOSIS — Z9079 Acquired absence of other genital organ(s): Secondary | ICD-10-CM

## 2018-05-19 DIAGNOSIS — Z90710 Acquired absence of both cervix and uterus: Secondary | ICD-10-CM | POA: Diagnosis not present

## 2018-05-19 DIAGNOSIS — J302 Other seasonal allergic rhinitis: Secondary | ICD-10-CM | POA: Insufficient documentation

## 2018-05-19 DIAGNOSIS — H3530 Unspecified macular degeneration: Secondary | ICD-10-CM | POA: Insufficient documentation

## 2018-05-19 DIAGNOSIS — Z9049 Acquired absence of other specified parts of digestive tract: Secondary | ICD-10-CM | POA: Diagnosis not present

## 2018-05-19 DIAGNOSIS — R630 Anorexia: Secondary | ICD-10-CM | POA: Insufficient documentation

## 2018-05-19 DIAGNOSIS — G250 Essential tremor: Secondary | ICD-10-CM | POA: Insufficient documentation

## 2018-05-19 DIAGNOSIS — E785 Hyperlipidemia, unspecified: Secondary | ICD-10-CM | POA: Insufficient documentation

## 2018-05-19 MED ORDER — MEGESTROL ACETATE 40 MG PO TABS: 40.0000 mg | ORAL_TABLET | Freq: Every day | ORAL | 1 refills | Status: DC

## 2018-05-19 NOTE — Progress Notes (Signed)
NEW GYN ENCOUNTER NOTE Primary care provider: Dr. Doy Hutching  Subjective:       Carla Warner is a 84 y.o. [phone] female is here for gynecologic evaluation of the following issues:  1.  Left lower quadrant pain  6-month history of left lower quadrant pain which began in October 2018.  The pain appears to be occurring at an increased frequency, occurring almost daily or every couple of days.  The pain may last minutes to hours but never longer than a day.  The intensity of the pain appears to be getting worse.  It is described as 8 out of 10 in intensity, with characteristics of sharp and stabbing and aching discomfort.  She states that occasionally there can be a crescendo decrescendo component.  This pain typically will affect her when she is performing housework; resting with her feet elevated for about an hour helps the pain to go away.  Bowel movements do not impact the pain.  Urination does not impact the pain.  Patient has not taking any medications to help alleviate symptoms. She reports no significant fever, chills, nausea, or vomiting.  Bowel function is notable for bouts of diarrhea and constipation as she has a diagnosis of irritable bowel.  The patient is status post TAH/BSO in 1981.  Reason for hysterectomy is not recalled. Patient also had a right ovarian cystectomy and appendectomy prior to 1981. She has never been on any ERT therapy.  She is not experiencing any vasomotor symptoms.  The patient reports that she had a long history of painful menses when she was younger; she does really cannot recall if she had to leave school or work because of pain.  Colonoscopy 02/18/2018 was notable for 2 small polyps within the rectum, 2 to 3 mm in size, along with many small mouth diverticuli within the sigmoid colon.  CT scan of the abdomen and pelvis on 10/29/2017 was notable for numerous colonic diverticuli without associated inflammation, primarily in the sigmoid colon; there is associated  large stool burden present.  Reproductive system did not reveal any adnexal masses.   Gynecologic History No LMP recorded. Patient has had a hysterectomy. Contraception: status post hysterectomy   Obstetric History OB History  Gravida Para Term Preterm AB Living  2 2 2     2   SAB TAB Ectopic Multiple Live Births          2    # Outcome Date GA Lbr Len/2nd Weight Sex Delivery Anes PTL Lv  2 Term 1963    M Vag-Spont   LIV  1 Term 1957    M Vag-Spont   LIV    Past Medical History:  Diagnosis Date  . Arthritis   . Cancer ([place])    basal cell ca-- face  . Encounter for blood transfusion   . Essential tremor   . GERD (gastroesophageal reflux disease)   . High cholesterol   . HLD (hyperlipidemia)   . Hypothyroidism   . IBS (irritable bowel syndrome)   . Legally blind   . Macular degeneration   . Thyroid disease     Past Surgical History:  Procedure Laterality Date  . ABDOMINAL HYSTERECTOMY    . basal cell removed    . COLONOSCOPY WITH PROPOFOL N/A 02/09/2018   Procedure: COLONOSCOPY WITH PROPOFOL;  Surgeon: Toledo, Benay Pike, MD;  Location: [facility] ENDOSCOPY;  Service: Gastroenterology;  Laterality: N/A;  . EYE SURGERY     cataract extracapsular  . JOINT REPLACEMENT Right  Current Outpatient Medications on File Prior to Visit  Medication Sig Dispense Refill  . atorvastatin (LIPITOR) 20 MG tablet Take 20 mg by mouth daily.    . cetirizine (ZYRTEC) 10 MG tablet Take 10 mg by mouth daily.    Marland Kitchen ketorolac (ACULAR) 0.5 % ophthalmic solution 1 drop 3 (three) times daily.    Marland Kitchen levothyroxine (SYNTHROID, LEVOTHROID) 25 MCG tablet Take 25 mcg by mouth daily before breakfast.    . prednisoLONE acetate (PRED FORTE) 1 % ophthalmic suspension 1 drop 3 (three) times daily.    . propranolol (INDERAL) 40 MG tablet Take 40 mg by mouth 2 (two) times daily.     No current facility-administered medications on file prior to visit.     Allergies  Allergen Reactions  . Adhesive [Tape]      Social History   Socioeconomic History  . Marital status: Widowed    Spouse name: Not on file  . Number of children: Not on file  . Years of education: Not on file  . Highest education level: Not on file  Occupational History  . Not on file  Social Needs  . Financial resource strain: Not on file  . Food insecurity:    Worry: Not on file    Inability: Not on file  . Transportation needs:    Medical: Not on file    Non-medical: Not on file  Tobacco Use  . Smoking status: Never Smoker  . Smokeless tobacco: Never Used  Substance and Sexual Activity  . Alcohol use: Yes    Comment: 1 glass of wine 2-3 times per week  . Drug use: No  . Sexual activity: Not Currently  Lifestyle  . Physical activity:    Days per week: Not on file    Minutes per session: Not on file  . Stress: Not on file  Relationships  . Social connections:    Talks on phone: Not on file    Gets together: Not on file    Attends religious service: Not on file    Active member of club or organization: Not on file    Attends meetings of clubs or organizations: Not on file    Relationship status: Not on file  . Intimate partner violence:    Fear of current or ex partner: Not on file    Emotionally abused: Not on file    Physically abused: Not on file    Forced sexual activity: Not on file  Other Topics Concern  . Not on file  Social History Narrative  . Not on file    Family History  Problem Relation Age of Onset  . Breast cancer Neg Hx   . Ovarian cancer Neg Hx   . Colon cancer Neg Hx     The following portions of the patient's history were reviewed and updated as appropriate: allergies, current medications, past family history, past medical history, past social history, past surgical history and problem list.  Review of Systems Review of Systems  Constitutional: Positive for weight loss. Negative for chills, diaphoresis and fever.  HENT: Negative.   Eyes:       Macular degeneration   Respiratory: Negative.   Cardiovascular: Negative.   Gastrointestinal: Positive for abdominal pain, constipation and diarrhea. Negative for blood in stool, melena, nausea and vomiting.       IBS  Genitourinary: Negative.   Musculoskeletal: Negative.   Skin: Negative.   Neurological: Negative.   Endo/Heme/Allergies: Negative.   Psychiatric/Behavioral: Negative.  Objective:   BP 129/76   Pulse 67   Ht 5\' 6"  (1.676 m)   Wt 127 lb 3.2 oz (57.7 kg)   BMI 20.53 kg/m   CONSTITUTIONAL: Well-developed, well-nourished female in no acute distress.  HENT:  Normocephalic, atraumatic.  NECK: Normal range of motion, supple, no masses.  Normal thyroid.  SKIN: Skin is warm and dry. No rash noted. Not diaphoretic. No erythema. No pallor. [place]: Alert and oriented to person, place, and time. PSYCHIATRIC: Normal mood and affect. Normal behavior. Normal judgment and thought content. CARDIOVASCULAR:Not Examined RESPIRATORY: Not Examined BACK: No CVA tenderness BREASTS: Not Examined ABDOMEN: Soft, non distended; Non tender.  No Organomegaly.  No hernias.  Well-healed Pfannenstiel skin incision and well-healed right lower quadrant transverse incision (appendectomy/ovarian cystectomy) PELVIC:  External Genitalia: Normal  BUS: Small urethral caruncle  Vagina: Mild to moderate atrophy; first-degree cystocele; no rectocele; no enterocele  Cervix: Surgically absent  Uterus: Surgically absent  Adnexa: Normal without palpable masses or tenderness  RV: Normal external exam; normal sphincter tone; no rectal masses  Bladder: Nontender MUSCULOSKELETAL: Normal range of motion. No tenderness.  No cyanosis, clubbing, or edema.     Assessment:   1. Left lower quadrant pain, 9 months duration, unclear etiology - US PELVIS (TRANSABDOMINAL ONLY); Future - US PELVIS TRANSVANGINAL NON-OB (TV ONLY); Future  2. Status post TAH-BSO  3. History of diverticulosis, identified on CT scan of abdomen and  pelvis with contrast as well as at time of colonoscopy  4. History of appendectomy  5. Weight loss  6. Decreased appetite   Differential diagnosis of left lower quadrant pain includes diverticular disease, colon neoplasm, pelvic adhesive disease, hernia, endometriosis, musculoskeletal etiology, psychogenic etiology, kidney or bladder issues. CT imaging with contrast and colonoscopy has demonstrated diverticular disease without active diverticulitis.  IBS is an ongoing issue and possibly can be contributory to the problem. Psychogenic/psychosocial factors including loss of spouse may also be a factor.  Finally, history of severe dysmenorrhea, status post TAH/BSO may be associated with endometriosis; although unlikely with new onset symptoms after many years post hysterectomy, it is still a possibility.  Pelvic adhesive disease is not likely an issue as If it were the etiology, it would have likely caused her pain component to be occurring many years prior.  Plan:   1.  Pelvic ultrasound; results will be made available. 2.  Extensive discussion regarding differential diagnosis and potential contributory factors exacerbating her symptom complex were reviewed. 3.  Patient is willing to try Megace 40 mg a day; this progestin can certainly suppress any residual endometriosis if present, and it may benefit her through stimulating her appetite and possibly assisted weight gain. 4.  Return in 4 weeks for follow-up 5.  Depression questionnaire and next visit  A total of 45 minutes were spent face-to-face with the patient during the encounter with greater than 50% dealing with counseling and coordination of care.  Brayton Mars, MD

## 2018-05-19 NOTE — Patient Instructions (Addendum)
1.  Pelvic ultrasound is ordered 2.  Begin megestrol acetate 40 mg daily 3.  Return in 4 weeks for follow-up   Megestrol tablets What is this medicine? MEGESTROL (me JES trol) belongs to a class of drugs known as progestins. Megestrol tablets are used to treat advanced breast or endometrial cancer. This medicine may be used for other purposes; ask your health care provider or pharmacist if you have questions. COMMON BRAND NAME(S): Megace What should I tell my health care provider before I take this medicine? They need to know if you have any of these conditions: -adrenal gland problems -history of blood clots of the legs, lungs, or other parts of the body -diabetes -kidney disease -liver disease -stroke -an unusual or allergic reaction to megestrol, other medicines, foods, dyes, or preservatives -pregnant or trying to get pregnant -breast-feeding How should I use this medicine? Take this medicine by mouth. Follow the directions on the prescription label. Do not take your medicine more often than directed. Take your doses at regular intervals. Do not stop taking except on the advice of your doctor or health care professional. Talk to your pediatrician regarding the use of this medicine in children. Special care may be needed. Overdosage: If you think you have taken too much of this medicine contact a poison control center or emergency room at once. NOTE: This medicine is only for you. Do not share this medicine with others. What if I miss a dose? If you miss a dose, take it as soon as you can. If it is almost time for your next dose, take only that dose. Do not take double or extra doses. What may interact with this medicine? Do not take this medicine with any of the following medications: -dofetilide This medicine may also interact with the following medications: -carbamazepine -indinavir -phenobarbital -phenytoin -primidone -rifampin -warfarin This list may not describe all  possible interactions. Give your health care provider a list of all the medicines, herbs, non-prescription drugs, or dietary supplements you use. Also tell them if you smoke, drink alcohol, or use illegal drugs. Some items may interact with your medicine. What should I watch for while using this medicine? Visit your doctor or health care professional for regular checks on your progress. Continue taking this medicine even if you feel better. It may take 2 months of regular use before you know if this medicine is working for your condition. If you are a female of child-bearing age, use an effective method of birth control while you are taking this medicine. This medicine should not be used by females who are pregnant or breast-feeding. There is a potential for serious side effects to an unborn child or to an infant. Talk to your health care professional or pharmacist for more information. If you have diabetes, this medicine may affect blood sugar levels. Check your blood sugar and talk to your doctor or health care professional if you notice changes. What side effects may I notice from receiving this medicine? Side effects that you should report to your doctor or health care professional as soon as possible: -difficulty breathing or shortness of breath -chest pain -dizziness -fluid retention -increased blood pressure -leg pain or swelling -nausea and vomiting -skin rash or itching -weakness Side effects that usually do not require medical attention (report to your doctor or health care professional if they continue or are bothersome): -breakthrough menstrual bleeding -hot flashes or flushing -increased appetite -mood changes -sweating -weight gain This list may not describe all possible  side effects. Call your doctor for medical advice about side effects. You may report side effects to FDA at 1-800-FDA-1088. Where should I keep my medicine? Keep out of the reach of children. Store at  controlled room temperature between 15 and 30 degrees C (59 and 86 degrees F). Protect from heat above 40 degrees C (104 degrees F). Throw away any unused medicine after the expiration date. NOTE: This sheet is a summary. It may not cover all possible information. If you have questions about this medicine, talk to your doctor, pharmacist, or health care provider.  2018 Elsevier/Gold Standard (2008-05-07 15:57:10)

## 2018-06-21 ENCOUNTER — Ambulatory Visit (INDEPENDENT_AMBULATORY_CARE_PROVIDER_SITE_OTHER): Payer: Medicare Other | Admitting: Obstetrics and Gynecology

## 2018-06-21 ENCOUNTER — Encounter: Payer: Self-pay | Admitting: Obstetrics and Gynecology

## 2018-06-21 VITALS — BP 127/72 | HR 79 | Ht 66.0 in | Wt 127.2 lb

## 2018-06-21 DIAGNOSIS — R1032 Left lower quadrant pain: Secondary | ICD-10-CM

## 2018-06-21 DIAGNOSIS — F419 Anxiety disorder, unspecified: Secondary | ICD-10-CM

## 2018-06-21 DIAGNOSIS — Z90710 Acquired absence of both cervix and uterus: Secondary | ICD-10-CM

## 2018-06-21 DIAGNOSIS — Z90722 Acquired absence of ovaries, bilateral: Secondary | ICD-10-CM

## 2018-06-21 DIAGNOSIS — F329 Major depressive disorder, single episode, unspecified: Secondary | ICD-10-CM

## 2018-06-21 DIAGNOSIS — Z8719 Personal history of other diseases of the digestive system: Secondary | ICD-10-CM | POA: Diagnosis not present

## 2018-06-21 DIAGNOSIS — F32A Depression, unspecified: Secondary | ICD-10-CM

## 2018-06-21 DIAGNOSIS — Z79899 Other long term (current) drug therapy: Secondary | ICD-10-CM

## 2018-06-21 DIAGNOSIS — Z9079 Acquired absence of other genital organ(s): Secondary | ICD-10-CM

## 2018-06-21 MED ORDER — SERTRALINE HCL 50 MG PO TABS: 50.0000 mg | ORAL_TABLET | Freq: Every day | ORAL | 1 refills | Status: DC

## 2018-06-21 NOTE — Progress Notes (Signed)
Chief complaint: 1.  Left lower quadrant pain 2.  Medication management  84 year old female who presents for follow-up on 28-month history of left lower quadrant pain which began in 2018.  Symptoms are persisting despite being started on Megace last month for suppression of possible endometriosis.  She is status post TAH/BSO.  She does have known history of diverticulosis. Megace has not altered her pelvic pain.  Side effects of the medication include sleepiness during the day which affects her routine activities.  Appetite has not been increased.  Patient has actually lost several pounds of weight over the past 4 weeks.  Ultrasound done 05/19/2018 was normal without evidence of adnexal abnormalities.  Patient has multiple chronic comorbidities including loss of vision from macular degeneration, hearing loss which is being evaluated by ENT.  GERD and IBS are ongoing issues; work-up this past year has been negative for ulcer disease.  Colon biopsies ruled out active colitis she is somewhat disgruntled by the fact that she is a burden to her son who lives locally who is having to take her to all of her doctor visits.  The patient is continuing to grieve the loss of her husband.  Appetite is down.  Chronic fatigue is present.  She has insomnia-actually falls asleep easily but cannot remain asleep.  OBJECTIVE: BP 127/72   Pulse 79   Ht 5\' 6"  (1.676 m)   Wt 127 lb 3.2 oz (57.7 kg)   BMI 20.53 kg/m  Well-appearing female in no acute distress.  Affect appears appropriate. PHQ-9 questionnaire score: 9-mild depressive symptoms  ASSESSMENT: 1.  30-month history of left lower quadrant pain, not improved with Megace therapy; doubt endometriosis as etiology of pain 2.  GI work-up notable for diverticular disease without active diverticulitis 3.  GERD; IBS 4.  Grief 5.  Mild anxiety/depression secondary to aging, loss of life partner, chronic physical comorbidities 6.  Decreased appetite with weight  loss  PLAN: 1.  Discontinue Megace therapy 2.  Begin sertraline 25 mg daily x7 days, then 50 mg daily thereafter 3.  Return in 6 weeks for follow-up 4.  Discussed mindfulness therapy, yoga, meditation.  Consider counseling through [facility] counseling center, flyer given   A total of 25 minutes were spent face-to-face with the patient during this encounter and over half of that time involved counseling and coordination of care.  Brayton Mars, MD  Note: This dictation was prepared with Dragon dictation along with smaller phrase technology. Any transcriptional errors that result from this process are unintentional.

## 2018-06-21 NOTE — Patient Instructions (Signed)
1.  Discontinue Megace therapy 2.  Begin sertraline 25 mg a day for 7 days, then 50 mg a day thereafter (1/2 tablet daily x7, then 1 tablet daily) 3.  Return in 6 weeks for follow-up 4.  [facility] counseling center information is given; mindfulness therapy is recommended.  Sertraline tablets What is this medicine? SERTRALINE (SER tra leen) is used to treat depression. It may also be used to treat obsessive compulsive disorder, panic disorder, post-trauma stress, premenstrual dysphoric disorder (PMDD) or social anxiety. This medicine may be used for other purposes; ask your health care provider or pharmacist if you have questions. COMMON BRAND NAME(S): Zoloft What should I tell my health care provider before I take this medicine? They need to know if you have any of these conditions: -bleeding disorders -bipolar disorder or a family history of bipolar disorder -glaucoma -heart disease -high blood pressure -history of irregular heartbeat -history of low levels of calcium, magnesium, or potassium in the blood -if you often drink alcohol -liver disease -receiving electroconvulsive therapy -seizures -suicidal thoughts, plans, or attempt; a previous suicide attempt by you or a family member -take medicines that treat or prevent blood clots -thyroid disease -an unusual or allergic reaction to sertraline, other medicines, foods, dyes, or preservatives -pregnant or trying to get pregnant -breast-feeding How should I use this medicine? Take this medicine by mouth with a glass of water. Follow the directions on the prescription label. You can take it with or without food. Take your medicine at regular intervals. Do not take your medicine more often than directed. Do not stop taking this medicine suddenly except upon the advice of your doctor. Stopping this medicine too quickly may cause serious side effects or your condition may worsen. A special MedGuide will be given to you by the pharmacist with  each prescription and refill. Be sure to read this information carefully each time. Talk to your pediatrician regarding the use of this medicine in children. While this drug may be prescribed for children as young as 7 years for selected conditions, precautions do apply. Overdosage: If you think you have taken too much of this medicine contact a poison control center or emergency room at once. NOTE: This medicine is only for you. Do not share this medicine with others. What if I miss a dose? If you miss a dose, take it as soon as you can. If it is almost time for your next dose, take only that dose. Do not take double or extra doses. What may interact with this medicine? Do not take this medicine with any of the following medications: -cisapride -dofetilide -dronedarone -linezolid -MAOIs like Carbex, Eldepryl, Marplan, Nardil, and Parnate -methylene blue (injected into a vein) -pimozide -thioridazine This medicine may also interact with the following medications: -alcohol -amphetamines -aspirin and aspirin-like medicines -certain medicines for depression, anxiety, or psychotic disturbances -certain medicines for fungal infections like ketoconazole, fluconazole, posaconazole, and itraconazole -certain medicines for irregular heart beat like flecainide, quinidine, propafenone -certain medicines for migraine headaches like almotriptan, eletriptan, frovatriptan, naratriptan, rizatriptan, sumatriptan, zolmitriptan -certain medicines for sleep -certain medicines for seizures like carbamazepine, valproic acid, phenytoin -certain medicines that treat or prevent blood clots like warfarin, enoxaparin, dalteparin -cimetidine -digoxin -diuretics -fentanyl -isoniazid -lithium -NSAIDs, medicines for pain and inflammation, like ibuprofen or naproxen -other medicines that prolong the QT interval (cause an abnormal heart rhythm) -rasagiline -safinamide -supplements like St. John's wort, kava  kava, valerian -tolbutamide -tramadol -tryptophan This list may not describe all possible interactions. Give your  health care provider a list of all the medicines, herbs, non-prescription drugs, or dietary supplements you use. Also tell them if you smoke, drink alcohol, or use illegal drugs. Some items may interact with your medicine. What should I watch for while using this medicine? Tell your doctor if your symptoms do not get better or if they get worse. Visit your doctor or health care professional for regular checks on your progress. Because it may take several weeks to see the full effects of this medicine, it is important to continue your treatment as prescribed by your doctor. Patients and their families should watch out for new or worsening thoughts of suicide or depression. Also watch out for sudden changes in feelings such as feeling anxious, agitated, panicky, irritable, hostile, aggressive, impulsive, severely restless, overly excited and hyperactive, or not being able to sleep. If this happens, especially at the beginning of treatment or after a change in dose, call your health care professional. Dennis Bast may get drowsy or dizzy. Do not drive, use machinery, or do anything that needs mental alertness until you know how this medicine affects you. Do not stand or sit up quickly, especially if you are an older patient. This reduces the risk of dizzy or fainting spells. Alcohol may interfere with the effect of this medicine. Avoid alcoholic drinks. Your mouth may get dry. Chewing sugarless gum or sucking hard candy, and drinking plenty of water may help. Contact your doctor if the problem does not go away or is severe. What side effects may I notice from receiving this medicine? Side effects that you should report to your doctor or health care professional as soon as possible: -allergic reactions like skin rash, itching or hives, swelling of the face, lips, or tongue -anxious -black, tarry  stools -changes in vision -confusion -elevated mood, decreased need for sleep, racing thoughts, impulsive behavior -eye pain -fast, irregular heartbeat -feeling faint or lightheaded, falls -feeling agitated, angry, or irritable -hallucination, loss of contact with reality -loss of balance or coordination -loss of memory -painful or prolonged erections -restlessness, pacing, inability to keep still -seizures -stiff muscles -suicidal thoughts or other mood changes -trouble sleeping -unusual bleeding or bruising -unusually weak or tired -vomiting Side effects that usually do not require medical attention (report to your doctor or health care professional if they continue or are bothersome): -change in appetite or weight -change in sex drive or performance -diarrhea -increased sweating -indigestion, nausea -tremors This list may not describe all possible side effects. Call your doctor for medical advice about side effects. You may report side effects to FDA at 1-800-FDA-1088. Where should I keep my medicine? Keep out of the reach of children. Store at room temperature between 15 and 30 degrees C (59 and 86 degrees F). Throw away any unused medicine after the expiration date. NOTE: This sheet is a summary. It may not cover all possible information. If you have questions about this medicine, talk to your doctor, pharmacist, or health care provider.  2018 Elsevier/Gold Standard (2016-10-23 14:17:49)

## 2018-08-02 ENCOUNTER — Ambulatory Visit (INDEPENDENT_AMBULATORY_CARE_PROVIDER_SITE_OTHER): Payer: Medicare Other | Admitting: Obstetrics and Gynecology

## 2018-08-02 ENCOUNTER — Encounter: Payer: Self-pay | Admitting: Obstetrics and Gynecology

## 2018-08-02 VITALS — BP 125/69 | HR 76 | Ht 66.0 in | Wt 129.5 lb

## 2018-08-02 DIAGNOSIS — F329 Major depressive disorder, single episode, unspecified: Secondary | ICD-10-CM | POA: Diagnosis not present

## 2018-08-02 DIAGNOSIS — F419 Anxiety disorder, unspecified: Secondary | ICD-10-CM | POA: Diagnosis not present

## 2018-08-02 DIAGNOSIS — R1032 Left lower quadrant pain: Secondary | ICD-10-CM

## 2018-08-02 DIAGNOSIS — F4321 Adjustment disorder with depressed mood: Secondary | ICD-10-CM | POA: Diagnosis not present

## 2018-08-02 DIAGNOSIS — Z8719 Personal history of other diseases of the digestive system: Secondary | ICD-10-CM | POA: Diagnosis not present

## 2018-08-02 DIAGNOSIS — H9193 Unspecified hearing loss, bilateral: Secondary | ICD-10-CM

## 2018-08-02 DIAGNOSIS — R634 Abnormal weight loss: Secondary | ICD-10-CM

## 2018-08-02 DIAGNOSIS — F32A Depression, unspecified: Secondary | ICD-10-CM

## 2018-08-02 DIAGNOSIS — K589 Irritable bowel syndrome without diarrhea: Secondary | ICD-10-CM

## 2018-08-02 DIAGNOSIS — R630 Anorexia: Secondary | ICD-10-CM

## 2018-08-02 DIAGNOSIS — F4329 Adjustment disorder with other symptoms: Secondary | ICD-10-CM

## 2018-08-02 DIAGNOSIS — H3530 Unspecified macular degeneration: Secondary | ICD-10-CM

## 2018-08-02 NOTE — Progress Notes (Signed)
Chief complaint: 1.  Left lower quadrant pain 2.  Aging comorbidities 3.  Conference-15 minutes  Summary: 84 year old female who presents for follow-up on 82-month history of left lower quadrant pain which began in 2018.  Symptoms persist despite trial of  Megace begun in July 2019 for suppression of possible endometriosis. Megace did not alter her pelvic pain.  Side effects of the medication included sleepiness during the day which affects her routine activities.  Appetite has not been increased.  Patient  actually lost several pounds of weight during therapy.  Medication was discontinued on 06/21/2018.  Sertraline was started on 06/21/2018 for management of anxiety/depression.  Patient took only several doses of the medication and discontinued it secondary to side effects.  She did not like feeling drugged, and had no energy for activities of daily living. Carla Warner was encouraged to consider counseling in August but never followed up with that recommendation.  She is status post TAH/BSO.  She is not on estrogen replacement therapy. Ultrasound done 05/19/2018 was normal without evidence of adnexal abnormalities. She does have known history of diverticulosis.  Patient has multiple chronic comorbidities including loss of vision from macular degeneration, hearing loss which is being evaluated by ENT.  GERD and IBS are ongoing issues; work-up this past year has been negative for ulcer disease.  Colon biopsies ruled out active colitis.  She is somewhat disgruntled by the fact that she is a burden to her son who lives locally who is having to take her to all of her doctor visits.  Carla Warner is reluctant to exercise due to decreased exercise tolerance as well as having difficulty with vision which frightens her about potential falls.  The patient is continuing to grieve the loss of her husband.  Appetite is down.  Chronic fatigue is present.  She has insomnia-actually falls asleep easily but cannot remain  asleep.  OBJECTIVE: BP 125/69   Pulse 76   Ht 5\' 6"  (1.676 m)   Wt 129 lb 8 oz (58.7 kg)   BMI 20.90 kg/m  Physical exam is deferred  ASSESSMENT: 1.  Chronic left lower quadrant pain, intermittent, not incapacitating 2.  Diverticulosis 3.  History of endometriosis; status post Megace trial without impact on abdominal pain 4.  GERD; IBS 5.  Grief, ongoing, following loss of spouse 6.  Anxiety/depression secondary to aging, loss of life partner, chronic physical comorbidities 7.  Macular degeneration 8.  Hearing loss  PLAN: 1.  Counseling session revolved around managing physical comorbidities, instituting lifestyle changes to help maintain wellness, and strong encouragement for proceeding with psychology/psychiatry consultation and therapy.  Patient states that she is willing to proceed with psychiatry referral. 2.  Referral is made to Dr. Cephus Shelling for psychiatry consultation  A total of 15 minutes were spent face-to-face with the patient during this encounter and over half of that time dealt with counseling and coordination of care.  Brayton Mars, MD  Note: This dictation was prepared with Dragon dictation along with smaller phrase technology. Any transcriptional errors that result from this process are unintentional.

## 2018-08-03 DIAGNOSIS — H9193 Unspecified hearing loss, bilateral: Secondary | ICD-10-CM | POA: Insufficient documentation

## 2018-08-03 DIAGNOSIS — F4321 Adjustment disorder with depressed mood: Secondary | ICD-10-CM | POA: Insufficient documentation

## 2018-08-03 NOTE — Patient Instructions (Signed)
1.  Referral to psychiatry-Dr. Cephus Shelling

## 2020-01-18 ENCOUNTER — Other Ambulatory Visit: Payer: Self-pay | Admitting: Family Medicine

## 2020-01-18 DIAGNOSIS — Z1231 Encounter for screening mammogram for malignant neoplasm of breast: Secondary | ICD-10-CM

## 2020-01-18 DIAGNOSIS — N644 Mastodynia: Secondary | ICD-10-CM

## 2020-02-12 ENCOUNTER — Ambulatory Visit
Admission: RE | Admit: 2020-02-12 | Discharge: 2020-02-12 | Disposition: A | Discharge status: Home / Self Care | Payer: Medicare Other | Source: Ambulatory Visit | Attending: Family Medicine | Admitting: Family Medicine

## 2020-02-12 DIAGNOSIS — Z1231 Encounter for screening mammogram for malignant neoplasm of breast: Secondary | ICD-10-CM | POA: Diagnosis present

## 2020-02-12 DIAGNOSIS — N644 Mastodynia: Secondary | ICD-10-CM

## 2020-05-08 ENCOUNTER — Other Ambulatory Visit: Payer: Self-pay | Admitting: Internal Medicine

## 2020-05-08 DIAGNOSIS — R1032 Left lower quadrant pain: Secondary | ICD-10-CM

## 2020-05-24 ENCOUNTER — Ambulatory Visit: Payer: Medicare Other

## 2020-05-29 ENCOUNTER — Other Ambulatory Visit: Payer: Self-pay

## 2020-05-29 ENCOUNTER — Ambulatory Visit
Admission: RE | Admit: 2020-05-29 | Discharge: 2020-05-29 | Disposition: A | Discharge status: Home / Self Care | Payer: Medicare Other | Source: Ambulatory Visit | Attending: Internal Medicine | Admitting: Internal Medicine

## 2020-05-29 DIAGNOSIS — R1032 Left lower quadrant pain: Secondary | ICD-10-CM

## 2020-05-29 MED ORDER — IOHEXOL 300 MG/ML  SOLN
75.0000 mL | Freq: Once | INTRAMUSCULAR | Status: AC | PRN
  Administered 2020-05-29: 75 mL via INTRAVENOUS

## 2021-04-29 ENCOUNTER — Other Ambulatory Visit: Payer: Self-pay | Admitting: Family Medicine

## 2021-04-29 DIAGNOSIS — N644 Mastodynia: Secondary | ICD-10-CM

## 2021-04-29 DIAGNOSIS — N6322 Unspecified lump in the left breast, upper inner quadrant: Secondary | ICD-10-CM

## 2021-05-02 ENCOUNTER — Ambulatory Visit
Admission: RE | Admit: 2021-05-02 | Discharge: 2021-05-02 | Disposition: A | Discharge status: Home / Self Care | Payer: Medicare Other | Source: Ambulatory Visit | Attending: Family Medicine | Admitting: Family Medicine

## 2021-05-02 ENCOUNTER — Other Ambulatory Visit: Payer: Self-pay

## 2021-05-02 DIAGNOSIS — N6322 Unspecified lump in the left breast, upper inner quadrant: Secondary | ICD-10-CM

## 2021-05-02 DIAGNOSIS — N644 Mastodynia: Secondary | ICD-10-CM | POA: Diagnosis present

## 2021-07-09 ENCOUNTER — Emergency Department
Admission: EM | Admit: 2021-07-09 | Discharge: 2021-07-09 | Disposition: A | Discharge status: Home / Self Care | Payer: Medicare Other | Attending: Emergency Medicine | Admitting: Emergency Medicine

## 2021-07-09 ENCOUNTER — Other Ambulatory Visit: Payer: Self-pay

## 2021-07-09 ENCOUNTER — Encounter: Payer: Self-pay | Admitting: Emergency Medicine

## 2021-07-09 ENCOUNTER — Emergency Department: Payer: Medicare Other

## 2021-07-09 DIAGNOSIS — E039 Hypothyroidism, unspecified: Secondary | ICD-10-CM | POA: Insufficient documentation

## 2021-07-09 DIAGNOSIS — S060X0A Concussion without loss of consciousness, initial encounter: Secondary | ICD-10-CM

## 2021-07-09 DIAGNOSIS — Z79899 Other long term (current) drug therapy: Secondary | ICD-10-CM | POA: Insufficient documentation

## 2021-07-09 DIAGNOSIS — Z85828 Personal history of other malignant neoplasm of skin: Secondary | ICD-10-CM | POA: Diagnosis not present

## 2021-07-09 DIAGNOSIS — R4781 Slurred speech: Secondary | ICD-10-CM | POA: Diagnosis not present

## 2021-07-09 DIAGNOSIS — W19XXXA Unspecified fall, initial encounter: Secondary | ICD-10-CM | POA: Diagnosis not present

## 2021-07-09 DIAGNOSIS — S0990XA Unspecified injury of head, initial encounter: Secondary | ICD-10-CM | POA: Diagnosis present

## 2021-07-09 LAB — URINALYSIS, COMPLETE (UACMP) WITH MICROSCOPIC
Bilirubin Urine: NEGATIVE
Glucose, UA: NEGATIVE mg/dL
Ketones, ur: 15 mg/dL — AB
Nitrite: NEGATIVE
Protein, ur: NEGATIVE mg/dL
Specific Gravity, Urine: 1.02 (ref 1.005–1.030)
WBC, UA: >50 WBC/hpf (ref 0–5)
pH: 5 (ref 5.0–8.0)

## 2021-07-09 MED ORDER — CEFDINIR 300 MG PO CAPS: 300.0000 mg | ORAL_CAPSULE | Freq: Two times a day (BID) | ORAL | 0 refills | Status: AC

## 2021-07-09 NOTE — ED Provider Notes (Signed)
[facility] Emergency Department Provider Note   ____________________________________________   Event Date/Time   First MD Initiated Contact with Patient 07/09/21 1117     (approximate)  I have reviewed the triage vital signs and the nursing notes.   HISTORY  Chief Complaint Fall    HPI Carla Warner is a 87 y.o. female who presents after a fall last night complaining of left posterior head pain and generalized weakness  LOCATION: Head DURATION: 1 day prior to arrival TIMING: Improved since onset SEVERITY: mild QUALITY: Aching CONTEXT: Patient states that she was drinking wine last night but does not remember exactly how she got on the floor.  Son at bedside and states that she found patient sitting on the floor in front of a trunk complaining of posterior head pain and had been having difficulty with her memory overnight as well as one episode of slurred speech. MODIFYING FACTORS: Denies any exacerbating or relieving factors ASSOCIATED SYMPTOMS: Slurred speech, amnesia, and generalized weakness   Per medical record review, patient has history of macular degeneration and hypothyroidism          Past Medical History:  Diagnosis Date   Arthritis    Cancer ([place])    basal cell ca-- face   Encounter for blood transfusion    Essential tremor    GERD (gastroesophageal reflux disease)    High cholesterol    HLD (hyperlipidemia)    Hypothyroidism    IBS (irritable bowel syndrome)    Legally blind    Macular degeneration    Thyroid disease     Patient Active Problem List   Diagnosis Date Noted   Grieving 08/03/2018   Bilateral hearing loss 08/03/2018   Decreased appetite 05/19/2018   Weight loss 05/19/2018   History of appendectomy 05/19/2018   History of diverticulosis 05/19/2018   Status post TAH-BSO 05/19/2018   Left lower quadrant pain 05/19/2018   Anxiety and depression 05/19/2018   Stress and adjustment reaction 05/19/2018    Seasonal allergic rhinitis 05/19/2018   IBS (irritable bowel syndrome) 05/19/2018   Macular degeneration 05/19/2018   Hypothyroid 05/19/2018   HLD (hyperlipidemia) 05/19/2018   Familial tremor 05/19/2018   Contusion of knee 02/17/2018   Closed fracture of lateral malleolus 02/17/2018    Past Surgical History:  Procedure Laterality Date   ABDOMINAL HYSTERECTOMY     basal cell removed     COLONOSCOPY WITH PROPOFOL N/A 02/09/2018   Procedure: COLONOSCOPY WITH PROPOFOL;  Surgeon: Toledo, Benay Pike, MD;  Location: [facility] ENDOSCOPY;  Service: Gastroenterology;  Laterality: N/A;   EYE SURGERY     cataract extracapsular   JOINT REPLACEMENT Right     Prior to Admission medications   Medication Sig Start Date End Date Taking? Authorizing Provider  cefdinir (OMNICEF) 300 MG capsule Take 1 capsule (300 mg total) by mouth 2 (two) times daily for 3 days. 07/09/21 07/12/21 Yes Naaman Plummer, MD  atorvastatin (LIPITOR) 20 MG tablet Take 20 mg by mouth daily.    [provider]  cetirizine (ZYRTEC) 10 MG tablet Take 10 mg by mouth daily.    [provider]  hyoscyamine (LEVBID) 0.375 MG 12 hr tablet Take by mouth. 06/13/18 09/11/18  [provider]  levothyroxine (SYNTHROID, LEVOTHROID) 25 MCG tablet Take 25 mcg by mouth daily before breakfast.    [provider]  propranolol (INDERAL) 40 MG tablet Take 40 mg by mouth 2 (two) times daily.    [provider]  Allergies Adhesive [tape]  Family History  Problem Relation Age of Onset   Breast cancer Neg Hx    Ovarian cancer Neg Hx    Colon cancer Neg Hx     Social History Social History   Tobacco Use   Smoking status: Never   Smokeless tobacco: Never  Vaping Use   Vaping Use: Never used  Substance Use Topics   Alcohol use: Yes    Comment: 1 glass of wine 2-3 times per week   Drug use: No    Review of Systems Constitutional: No fever/chills Eyes: No visual changes. ENT: No sore  throat. Cardiovascular: Denies chest pain. Respiratory: Denies shortness of breath. Gastrointestinal: No abdominal pain.  No nausea, no vomiting.  No diarrhea. Genitourinary: Negative for dysuria. Musculoskeletal: Negative for acute arthralgias Skin: Negative for rash. Neurological: Positive for headaches and generalized weakness, numbness/paresthesias in any extremity Psychiatric: Negative for suicidal ideation/homicidal ideation   ____________________________________________   PHYSICAL EXAM:  VITAL SIGNS: ED Triage Vitals  Enc Vitals Group     BP 07/09/21 1106 (!) 141/78     Pulse Rate 07/09/21 1106 (!) 105     Resp 07/09/21 1106 16     Temp 07/09/21 1106 98.9 F (37.2 C)     Temp Source 07/09/21 1106 Oral     SpO2 07/09/21 1106 96 %     Weight 07/09/21 1039 129 lb 6.6 oz (58.7 kg)     Height 07/09/21 1039 '5\' 6"'$  (1.676 m)     Head Circumference --      Peak Flow --      Pain Score 07/09/21 1039 5     Pain Loc --      Pain Edu? --      Excl. in [place]? --    Constitutional: Alert and oriented. Well appearing and in no acute distress. Eyes: Conjunctivae are normal. PERRL. Head: Left occipital scalp contusion Nose: No congestion/rhinnorhea. Mouth/Throat: Mucous membranes are moist. Neck: No stridor Cardiovascular: Grossly normal heart sounds.  Good peripheral circulation. Respiratory: Normal respiratory effort.  No retractions. Gastrointestinal: Soft and nontender. No distention. Musculoskeletal: No obvious deformities Neurologic:  Normal speech and language. No gross focal neurologic deficits are appreciated. Skin: Small skin tear to the right dorsal forearm that is hemostatic and clean.  Skin is warm and dry. No rash noted. Psychiatric: Mood and affect are normal. Speech and behavior are normal.  ____________________________________________   LABS (all labs ordered are listed, but only abnormal results are displayed)  Labs Reviewed  URINALYSIS, COMPLETE (UACMP)  WITH MICROSCOPIC - Abnormal; Notable for the following components:      Result Value   Hgb urine dipstick TRACE (*)    Ketones, ur 15 (*)    Leukocytes,Ua LARGE (*)    Bacteria, UA FEW (*)    All other components within normal limits   ____________________________________________  EKG  ED ECG REPORT I, Naaman Plummer, the attending physician, personally viewed and interpreted this ECG.  Date: 07/09/2021 EKG Time: 1415 Rate: 98 Rhythm: normal sinus rhythm QRS Axis: normal Intervals: normal ST/T Wave abnormalities: normal Narrative Interpretation: no evidence of acute ischemia  ____________________________________________  RADIOLOGY  ED MD interpretation: X-ray of the left ribs and chest did not show any evidence of acute cardiopulmonary abnormalities including no displaced rib fractures  CT of the head without contrast shows no evidence of acute abnormalities including no intracerebral hemorrhage, obvious masses, or significant edema  CT of the cervical spine did not show any evidence  of acute fractures or dislocations  CT of the maxillofacial structures not show any evidence of acute fractures, dislocations, or significant bleeding  Official radiology report(s): DG Ribs Unilateral W/Chest Left  Result Date: 07/09/2021 CLINICAL DATA:  Fall.  Rib pain EXAM: LEFT RIBS AND CHEST - 3+ VIEW COMPARISON:  None. FINDINGS: Normal heart size. No pleural effusion or edema. Lungs are hyperinflated with mild chronic interstitial coarsening. No superimposed airspace consolidation, atelectasis, or pneumothorax. No displaced rib fractures identified. IMPRESSION: 1. No acute cardiopulmonary abnormalities. 2. No displaced rib fractures identified. Electronically Signed   By: Kerby Moors M.D.   On: 07/09/2021 12:27   CT Head Wo Contrast  Result Date: 07/09/2021 CLINICAL DATA:  Facial trauma in an 87 year old female after ingesting wine by report. EXAM: CT HEAD WITHOUT CONTRAST CT  MAXILLOFACIAL WITHOUT CONTRAST CT CERVICAL SPINE WITHOUT CONTRAST TECHNIQUE: Multidetector CT imaging of the head, cervical spine, and maxillofacial structures were performed using the standard protocol without intravenous contrast. Multiplanar CT image reconstructions of the cervical spine and maxillofacial structures were also generated. COMPARISON:  Head CT from 2019. FINDINGS: CT HEAD FINDINGS Brain: No evidence of acute infarction, hemorrhage, hydrocephalus, extra-axial collection or mass lesion/mass effect. Signs of atrophy and chronic microvascular ischemic change as before. Vascular: No hyperdense vessel or unexpected calcification. Skull: Normal. Negative for fracture or focal lesion. Other: None CT MAXILLOFACIAL FINDINGS Osseous: No fracture or mandibular dislocation. No destructive process. Orbits: Negative. No traumatic or inflammatory finding. Sinuses: Mucous retention cyst or polyp in the LEFT maxillary sinus. No sign of fluid level Soft tissues: Negative. CT CERVICAL SPINE FINDINGS Alignment: Mild accentuation of normal lordotic curvature of the cervical spine in the setting of degenerative changes. Skull base and vertebrae: No acute fracture. No primary bone lesion or focal pathologic process. Soft tissues and spinal canal: No prevertebral fluid or swelling. No visible canal hematoma. Disc levels: Degenerative changes in the cervical spine greatest in the atlanto axial joint and in the midcervical spine at C4-5, C5-6 and C6-C7. Upper chest: Mild biapical scarring. Other: None IMPRESSION: No acute intracranial abnormality. Signs of atrophy and chronic microvascular ischemic change as before. No evidence for acute facial bone fracture. No evidence for acute fracture or static subluxation of the cervical spine. Degenerative changes of the cervical spine. Electronically Signed   By: Zetta Bills M.D.   On: 07/09/2021 13:33   CT Cervical Spine Wo Contrast  Result Date: 07/09/2021 CLINICAL DATA:   Facial trauma in an 87 year old female after ingesting wine by report. EXAM: CT HEAD WITHOUT CONTRAST CT MAXILLOFACIAL WITHOUT CONTRAST CT CERVICAL SPINE WITHOUT CONTRAST TECHNIQUE: Multidetector CT imaging of the head, cervical spine, and maxillofacial structures were performed using the standard protocol without intravenous contrast. Multiplanar CT image reconstructions of the cervical spine and maxillofacial structures were also generated. COMPARISON:  Head CT from 2019. FINDINGS: CT HEAD FINDINGS Brain: No evidence of acute infarction, hemorrhage, hydrocephalus, extra-axial collection or mass lesion/mass effect. Signs of atrophy and chronic microvascular ischemic change as before. Vascular: No hyperdense vessel or unexpected calcification. Skull: Normal. Negative for fracture or focal lesion. Other: None CT MAXILLOFACIAL FINDINGS Osseous: No fracture or mandibular dislocation. No destructive process. Orbits: Negative. No traumatic or inflammatory finding. Sinuses: Mucous retention cyst or polyp in the LEFT maxillary sinus. No sign of fluid level Soft tissues: Negative. CT CERVICAL SPINE FINDINGS Alignment: Mild accentuation of normal lordotic curvature of the cervical spine in the setting of degenerative changes. Skull base and vertebrae: No acute fracture. No primary  bone lesion or focal pathologic process. Soft tissues and spinal canal: No prevertebral fluid or swelling. No visible canal hematoma. Disc levels: Degenerative changes in the cervical spine greatest in the atlanto axial joint and in the midcervical spine at C4-5, C5-6 and C6-C7. Upper chest: Mild biapical scarring. Other: None IMPRESSION: No acute intracranial abnormality. Signs of atrophy and chronic microvascular ischemic change as before. No evidence for acute facial bone fracture. No evidence for acute fracture or static subluxation of the cervical spine. Degenerative changes of the cervical spine. Electronically Signed   By: Zetta Bills  M.D.   On: 07/09/2021 13:33   CT Maxillofacial WO CM  Result Date: 07/09/2021 CLINICAL DATA:  Facial trauma in an 87 year old female after ingesting wine by report. EXAM: CT HEAD WITHOUT CONTRAST CT MAXILLOFACIAL WITHOUT CONTRAST CT CERVICAL SPINE WITHOUT CONTRAST TECHNIQUE: Multidetector CT imaging of the head, cervical spine, and maxillofacial structures were performed using the standard protocol without intravenous contrast. Multiplanar CT image reconstructions of the cervical spine and maxillofacial structures were also generated. COMPARISON:  Head CT from 2019. FINDINGS: CT HEAD FINDINGS Brain: No evidence of acute infarction, hemorrhage, hydrocephalus, extra-axial collection or mass lesion/mass effect. Signs of atrophy and chronic microvascular ischemic change as before. Vascular: No hyperdense vessel or unexpected calcification. Skull: Normal. Negative for fracture or focal lesion. Other: None CT MAXILLOFACIAL FINDINGS Osseous: No fracture or mandibular dislocation. No destructive process. Orbits: Negative. No traumatic or inflammatory finding. Sinuses: Mucous retention cyst or polyp in the LEFT maxillary sinus. No sign of fluid level Soft tissues: Negative. CT CERVICAL SPINE FINDINGS Alignment: Mild accentuation of normal lordotic curvature of the cervical spine in the setting of degenerative changes. Skull base and vertebrae: No acute fracture. No primary bone lesion or focal pathologic process. Soft tissues and spinal canal: No prevertebral fluid or swelling. No visible canal hematoma. Disc levels: Degenerative changes in the cervical spine greatest in the atlanto axial joint and in the midcervical spine at C4-5, C5-6 and C6-C7. Upper chest: Mild biapical scarring. Other: None IMPRESSION: No acute intracranial abnormality. Signs of atrophy and chronic microvascular ischemic change as before. No evidence for acute facial bone fracture. No evidence for acute fracture or static subluxation of the cervical  spine. Degenerative changes of the cervical spine. Electronically Signed   By: Zetta Bills M.D.   On: 07/09/2021 13:33    ____________________________________________   PROCEDURES  Procedure(s) performed (including Critical Care):  Procedures   ____________________________________________   INITIAL IMPRESSION / ASSESSMENT AND PLAN / ED COURSE  As part of my medical decision making, I reviewed the following data within the electronic medical record, if available:  Nursing notes reviewed and incorporated, Labs reviewed, EKG interpreted, Old chart reviewed, Radiograph reviewed and Notes from prior ED visits reviewed and incorporated        Patient presenting with head trauma.  Patient's neurological exam was non-focal and unremarkable.  [place] Head CT Rule was applied and patient did not fall into the low risk category so a head CT was obtained.  This showed no significant findings.  At this time, it is felt that the most likely explanation for the patient's symptoms is concussion.   I also considered SAH, SDH, Epidural Hematoma, IPH, skull fracture, migraine but this appears less likely considering the data gathered thus far.   Patient provided concussion protocol.   Patient remained stable and neurologically intact while in the emergency department.  Discussed warning signs that would prompt return to ED.  Head trauma  handout was provided.  Discussed in detail concussion management.  No sports or strenuous activity until symptoms free.  Return to emergency department urgently if new or worsening symptoms develop.    Impression:  Concussion Scalp contusion Skin tear to right forearm  Plan  Discharge from ED Tylenol for pain control. Avoid aspirin, NSAIDs, or other blood thinners. Advised patient on supportive measures for cognitive rest - avoid use of cognitive function for at least 24 hours.  This means no tv, books, texting, computers, etc. Limit visitors to the house.  Head  trauma instructions provided in discharge instructions Instructed Pt to monitor for neurologic symptoms, severe HA, change in mental status, seizures, loss of conciousness. Instructed Pt to f/up w/ PCP in 3 days or ETC should symptoms worsen or not improve. Pt verbally expressed understanding and all questions were addressed to Pt's satisfaction.      ____________________________________________   FINAL CLINICAL IMPRESSION(S) / ED DIAGNOSES  Final diagnoses:  Fall, initial encounter  Injury of head, initial encounter  Concussion without loss of consciousness, initial encounter     ED Discharge Orders          Ordered    cefdinir (OMNICEF) 300 MG capsule  2 times daily        07/09/21 1527             Note:  This document was prepared using Dragon voice recognition software and may include unintentional dictation errors.    Naaman Plummer, MD 07/09/21 [phone]

## 2021-07-09 NOTE — ED Triage Notes (Signed)
Per Son, patient fell last night while drinking wine.  C/O left rib pain and some head pain and blurred vision.  Patient does not recall fall, and was found sitting up on bedroom floor.    Patient is AAOx3.  Skin warm and dry. MAE equally and strong. NAD

## 2021-07-21 ENCOUNTER — Other Ambulatory Visit: Payer: Self-pay | Admitting: Internal Medicine

## 2021-07-21 DIAGNOSIS — R413 Other amnesia: Secondary | ICD-10-CM

## 2021-07-31 ENCOUNTER — Ambulatory Visit
Admission: RE | Admit: 2021-07-31 | Discharge: 2021-07-31 | Disposition: A | Discharge status: Home / Self Care | Payer: Medicare Other | Source: Ambulatory Visit | Attending: Internal Medicine | Admitting: Internal Medicine

## 2021-07-31 ENCOUNTER — Other Ambulatory Visit: Payer: Self-pay

## 2021-07-31 DIAGNOSIS — R413 Other amnesia: Secondary | ICD-10-CM | POA: Diagnosis present

## 2022-09-08 IMAGING — CT CT HEAD W/O CM
3 series · 15 of 47 positions shown, 18 images · non-contrast
Comparison: Head CT from 4379.

CLINICAL DATA: Facial trauma in an 88-year-old female after
ingesting wine by report.

EXAM:
CT HEAD WITHOUT CONTRAST
CT MAXILLOFACIAL WITHOUT CONTRAST
CT CERVICAL SPINE WITHOUT CONTRAST
TECHNIQUE: Multidetector CT imaging of the head, cervical spine, and
maxillofacial structures were performed using the standard protocol
without intravenous contrast. Multiplanar CT image reconstructions
of the cervical spine and maxillofacial structures were also
generated.

[Series 3: head wo · axial · 0.42mm/px · z∈[-177,-47]mm · 9 of 32 slices shown, 12 images]
[im 3/32  brain]
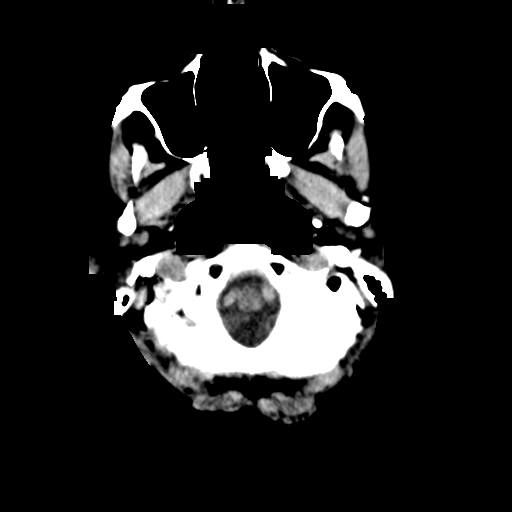
[im 3/32  bone]
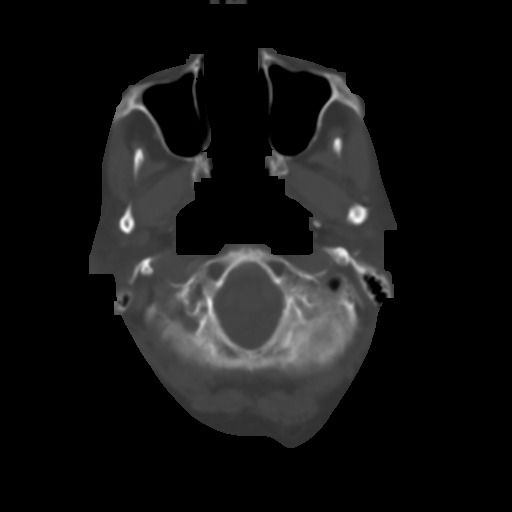
[im 6/32  brain]
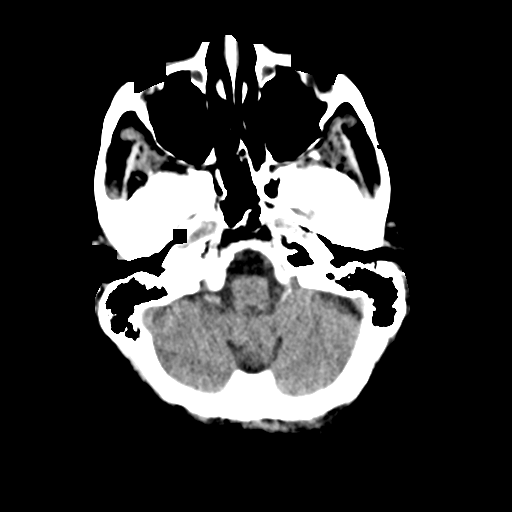
[im 9/32  brain]
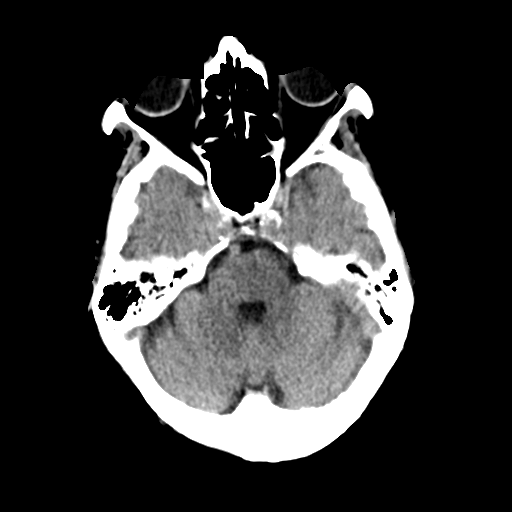
[im 12/32  brain]
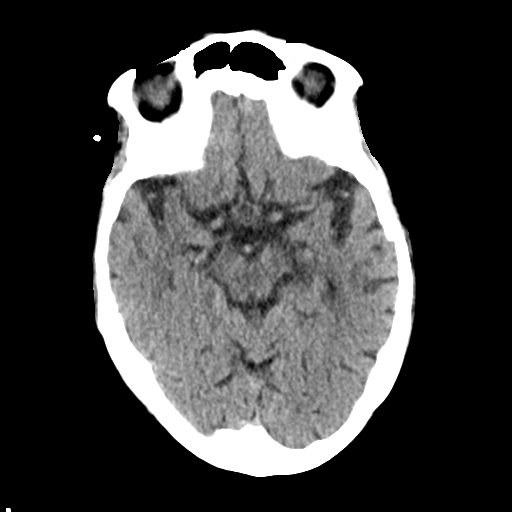
[im 17/32  brain]
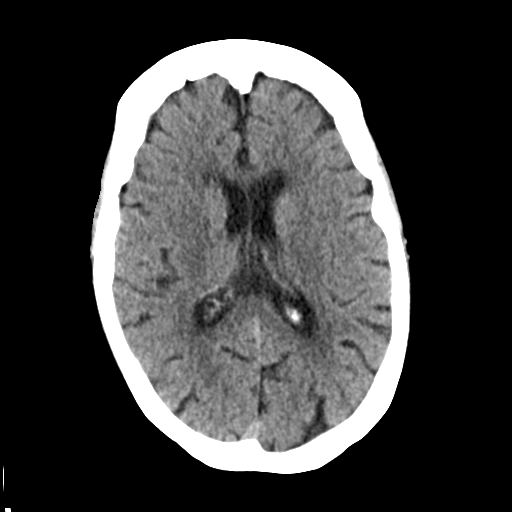
[im 17/32  bone]
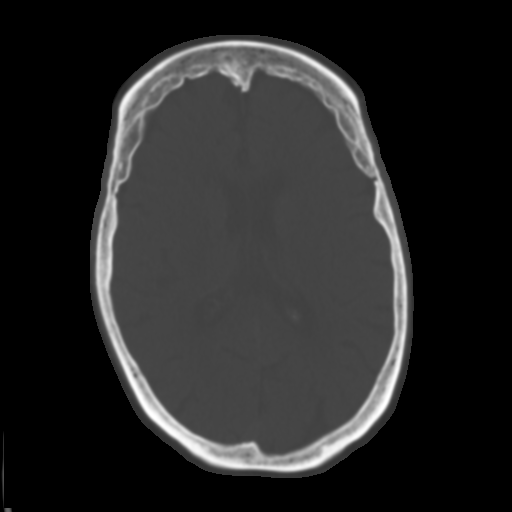
[im 20/32  brain]
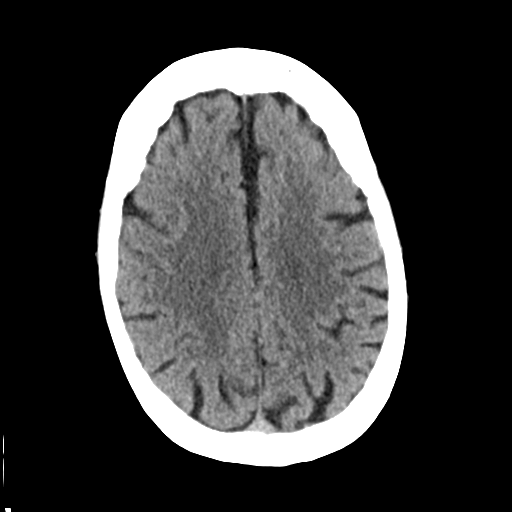
[im 23/32  brain]
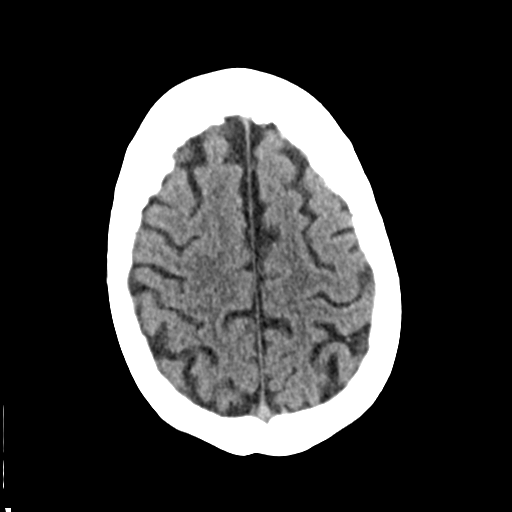
[im 26/32  brain]
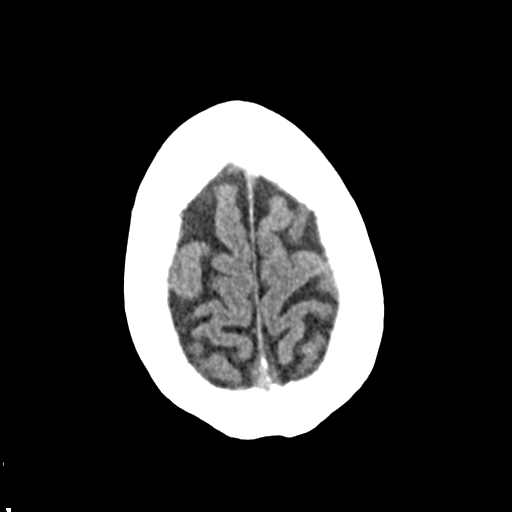
[im 29/32  brain]
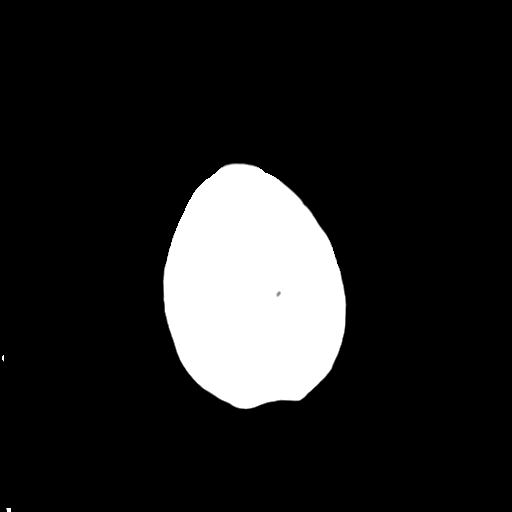
[im 29/32  bone]
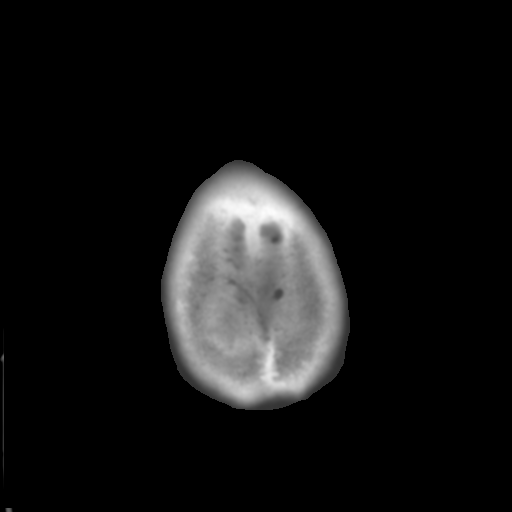

[Series 4: coronal soft tissue · coronal · 0.32mm/px · 3 of 70 slices shown]
[im 24/70  brain]
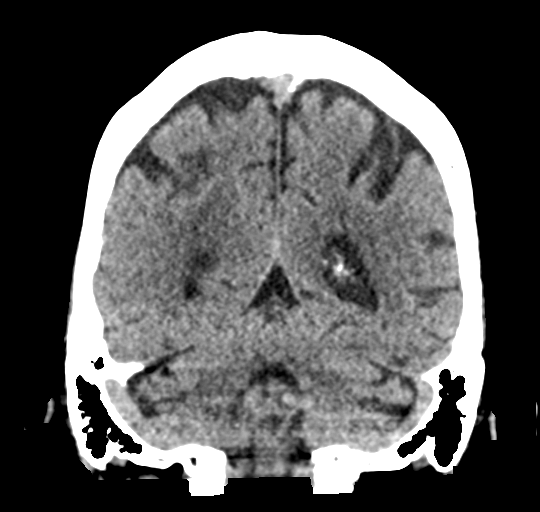
[im 31/70  brain]
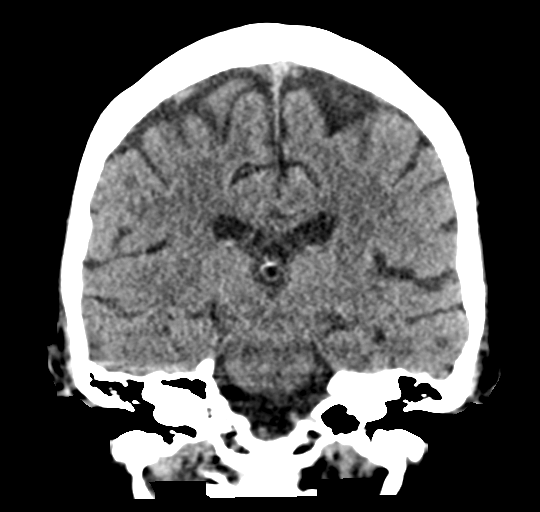
[im 39/70  brain]
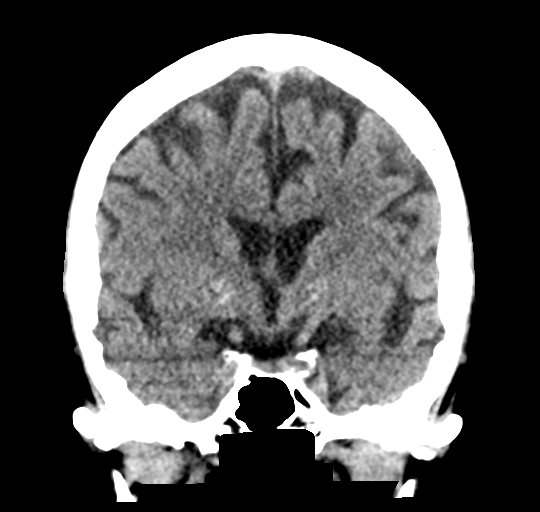

[Series 5: sagittal soft tissue · sagittal · 0.34mm/px · 3 of 58 slices shown]
[im 20/58  brain]
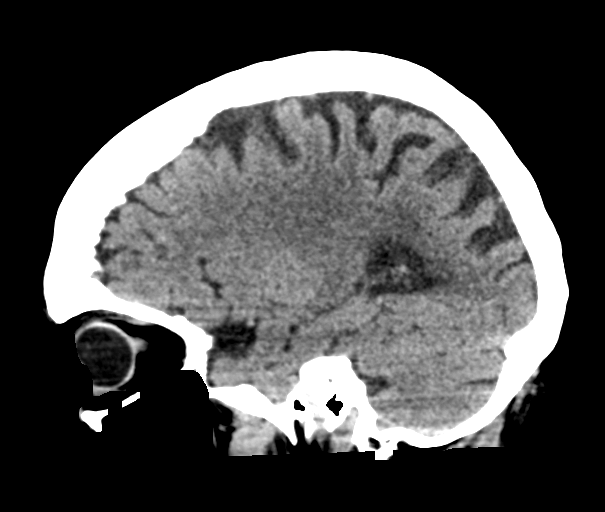
[im 29/58  brain]
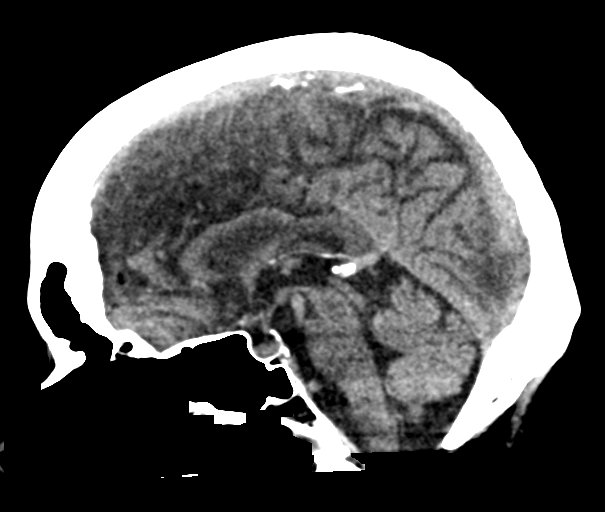
[im 39/58  brain]
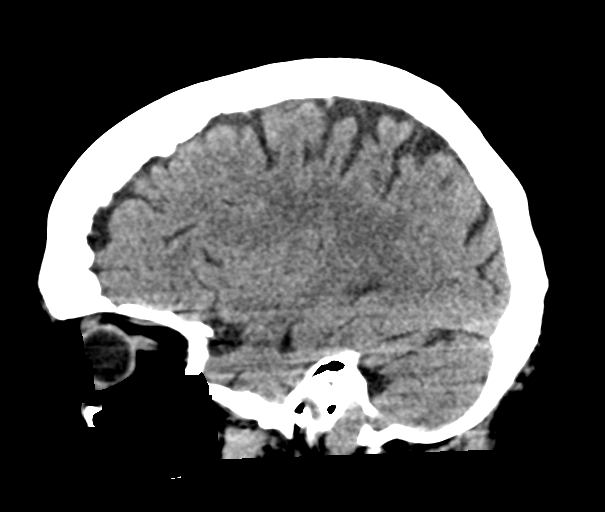

[15 of 47 positions shown; findings below may reference images not displayed]

FINDINGS: CT HEAD FINDINGS

Brain: No evidence of acute infarction, hemorrhage, hydrocephalus,
extra-axial collection or mass lesion/mass effect. Signs of atrophy
and chronic microvascular ischemic change as before.

Vascular: No hyperdense vessel or unexpected calcification.

Skull: Normal. Negative for fracture or focal lesion.

Other: None

CT MAXILLOFACIAL FINDINGS

Osseous: No fracture or mandibular dislocation. No destructive
process.

Orbits: Negative. No traumatic or inflammatory finding.

Sinuses: Mucous retention cyst or polyp in the LEFT maxillary sinus.
No sign of fluid level

Soft tissues: Negative.

CT CERVICAL SPINE FINDINGS

Alignment: Mild accentuation of normal lordotic curvature of the
cervical spine in the setting of degenerative changes.

Skull base and vertebrae: No acute fracture. No primary bone lesion
or focal pathologic process.

Soft tissues and spinal canal: No prevertebral fluid or swelling. No
visible canal hematoma.

Disc levels: Degenerative changes in the cervical spine greatest in
the atlanto axial joint and in the midcervical spine at C4-5, C5-6
and C6-C7.

Upper chest: Mild biapical scarring.

Other: None
IMPRESSION: No acute intracranial abnormality.

Signs of atrophy and chronic microvascular ischemic change as
before.

No evidence for acute facial bone fracture.

No evidence for acute fracture or static subluxation of the cervical
spine.

Degenerative changes of the cervical spine.

## 2022-09-08 IMAGING — CT CT CERVICAL SPINE W/O CM
3 of 4 series · 12 of 33 positions shown, 14 images · non-contrast
Comparison: Head CT from 4379.

CLINICAL DATA: Facial trauma in an 88-year-old female after
ingesting wine by report.

EXAM:
CT HEAD WITHOUT CONTRAST
CT MAXILLOFACIAL WITHOUT CONTRAST
CT CERVICAL SPINE WITHOUT CONTRAST
TECHNIQUE: Multidetector CT imaging of the head, cervical spine, and
maxillofacial structures were performed using the standard protocol
without intravenous contrast. Multiplanar CT image reconstructions
of the cervical spine and maxillofacial structures were also
generated.

[Series 5: sagittal bone · sagittal · 0.26mm/px · 5 of 66 slices shown, 6 images]
[im 22/66  bone]
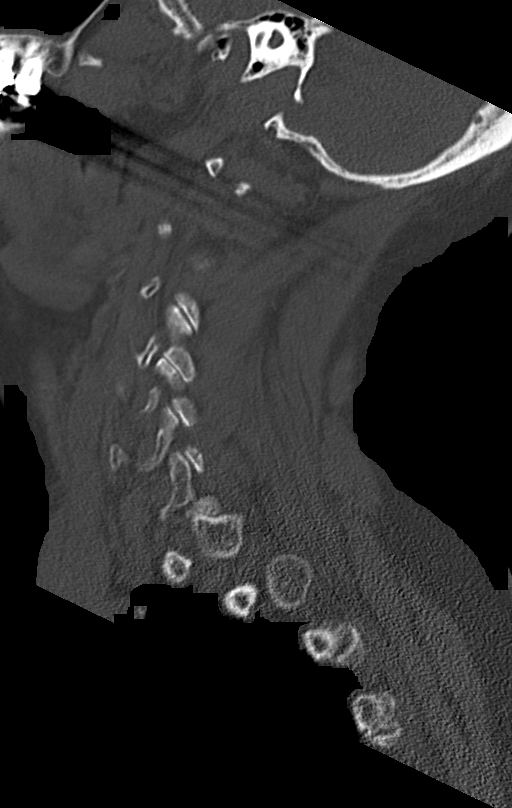
[im 28/66  bone]
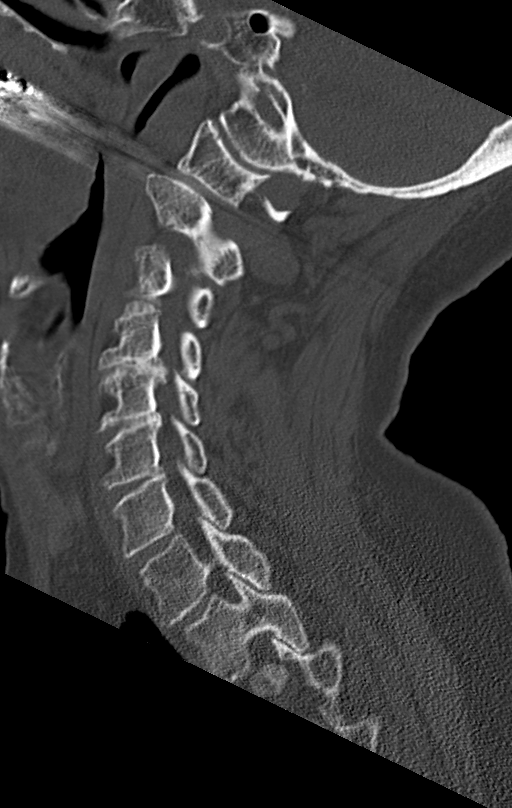
[im 33/66  soft-tissue]
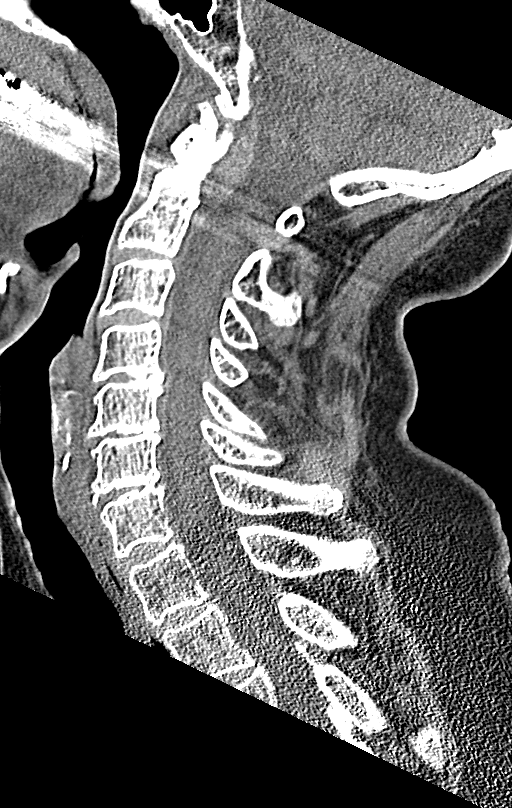
[im 33/66  bone]
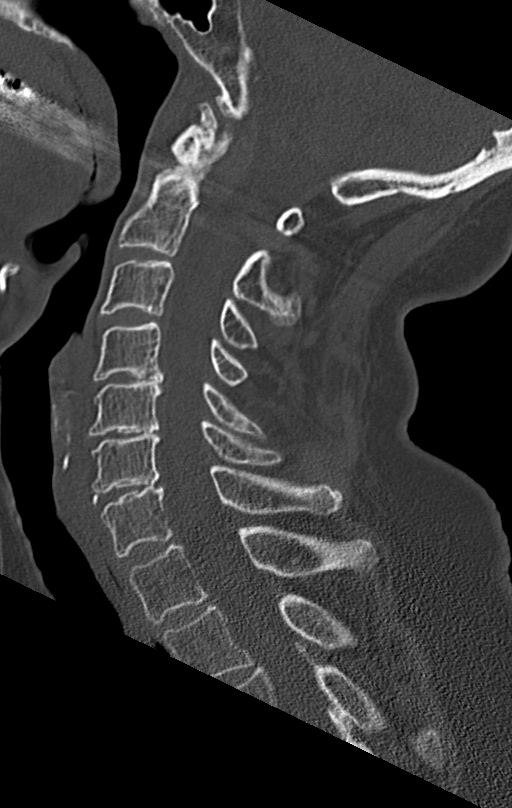
[im 38/66  bone]
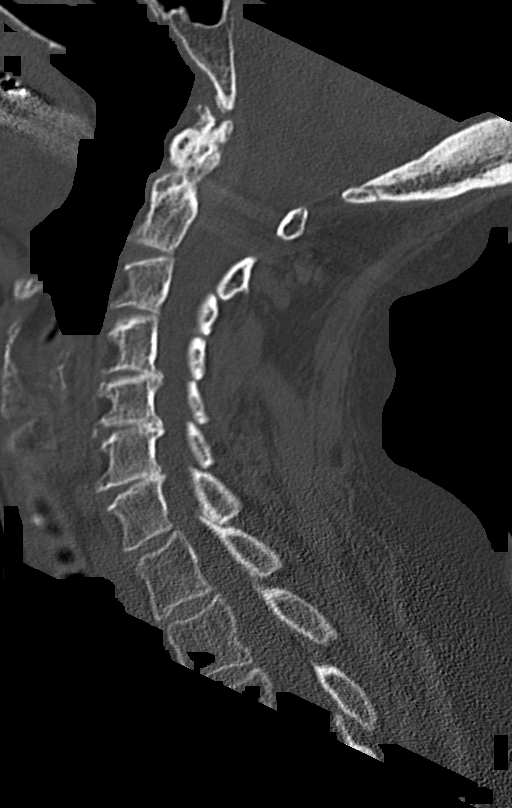
[im 44/66  bone]
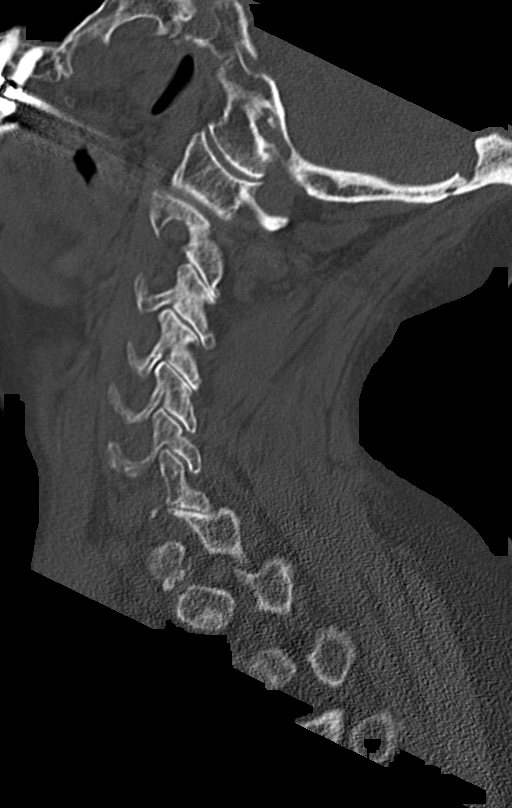

[Series 6: coronal bone · coronal · 0.26mm/px · 3 of 68 slices shown]
[im 17/68  bone]
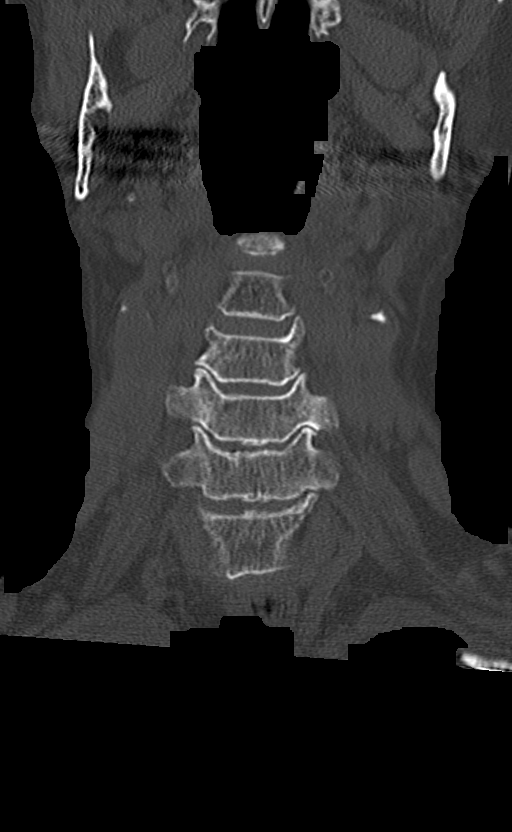
[im 28/68  bone]
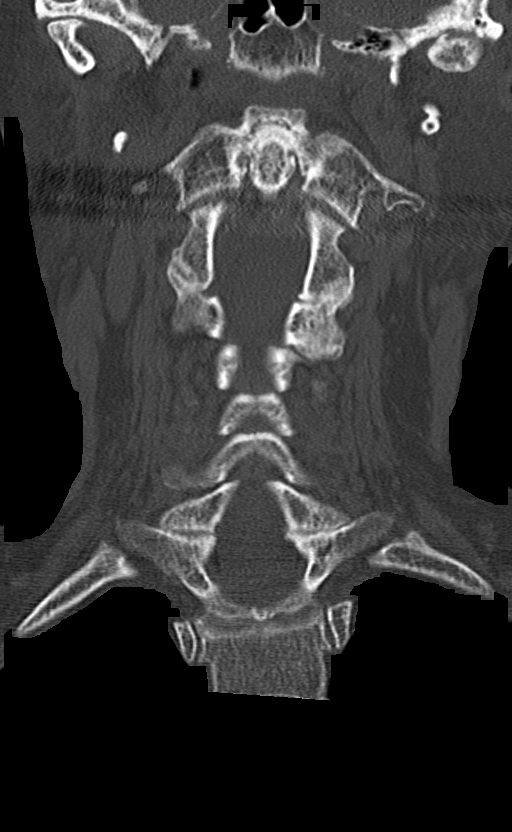
[im 40/68  bone]
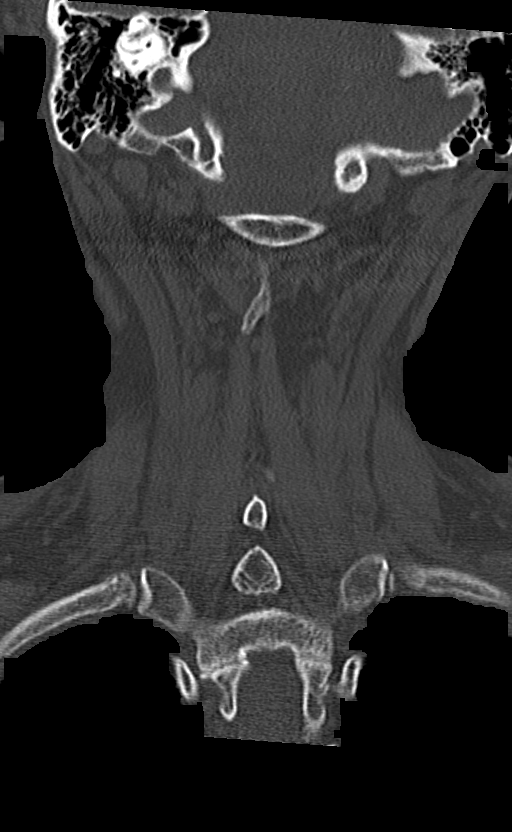

[Series 7: orthogonal axials · axial · 0.26mm/px · z∈[-361,-226]mm · 4 of 107 slices shown, 5 images]
[im 16/107  soft-tissue]
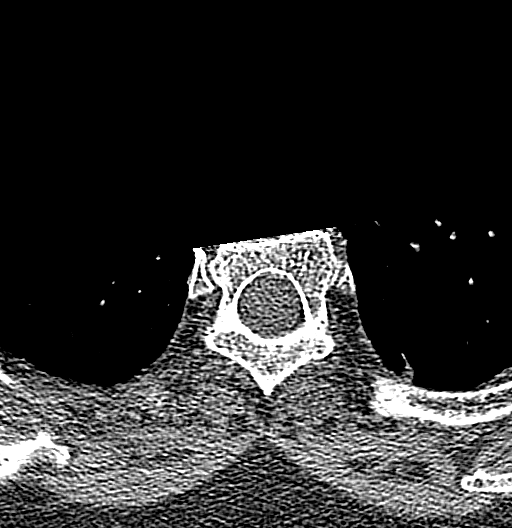
[im 16/107  bone]
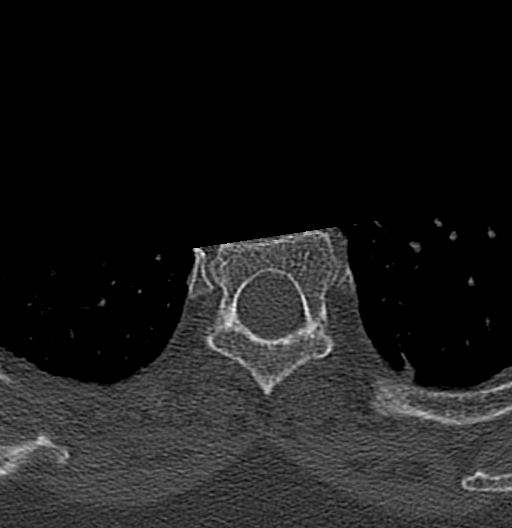
[im 46/107  bone]
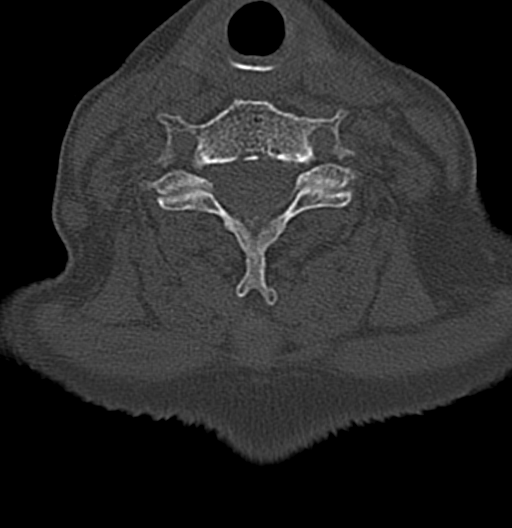
[im 61/107  bone]
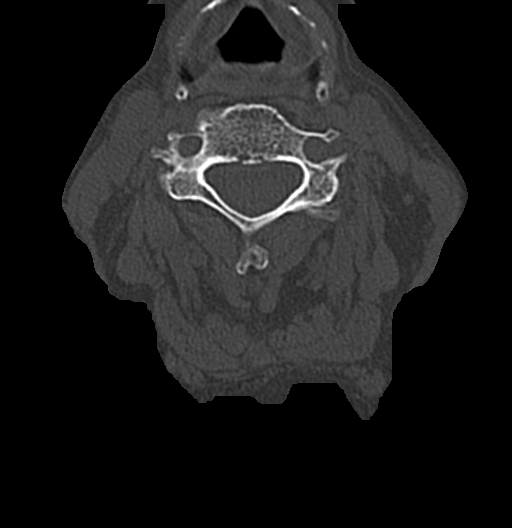
[im 91/107  bone]
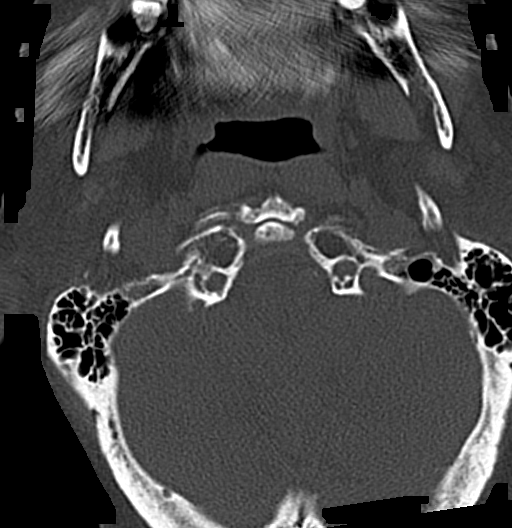

[12 of 33 positions shown; findings below may reference images not displayed]

FINDINGS: CT HEAD FINDINGS

Brain: No evidence of acute infarction, hemorrhage, hydrocephalus,
extra-axial collection or mass lesion/mass effect. Signs of atrophy
and chronic microvascular ischemic change as before.

Vascular: No hyperdense vessel or unexpected calcification.

Skull: Normal. Negative for fracture or focal lesion.

Other: None

CT MAXILLOFACIAL FINDINGS

Osseous: No fracture or mandibular dislocation. No destructive
process.

Orbits: Negative. No traumatic or inflammatory finding.

Sinuses: Mucous retention cyst or polyp in the LEFT maxillary sinus.
No sign of fluid level

Soft tissues: Negative.

CT CERVICAL SPINE FINDINGS

Alignment: Mild accentuation of normal lordotic curvature of the
cervical spine in the setting of degenerative changes.

Skull base and vertebrae: No acute fracture. No primary bone lesion
or focal pathologic process.

Soft tissues and spinal canal: No prevertebral fluid or swelling. No
visible canal hematoma.

Disc levels: Degenerative changes in the cervical spine greatest in
the atlanto axial joint and in the midcervical spine at C4-5, C5-6
and C6-C7.

Upper chest: Mild biapical scarring.

Other: None
IMPRESSION: No acute intracranial abnormality.

Signs of atrophy and chronic microvascular ischemic change as
before.

No evidence for acute facial bone fracture.

No evidence for acute fracture or static subluxation of the cervical
spine.

Degenerative changes of the cervical spine.

## 2022-09-08 IMAGING — CR DG RIBS W/ CHEST 3+V*L*
3 series · 3 of 3 positions shown · non-contrast
Comparison: None.

CLINICAL DATA: Fall.  Rib pain

EXAM:
LEFT RIBS AND CHEST - 3+ VIEW

[chest pa]
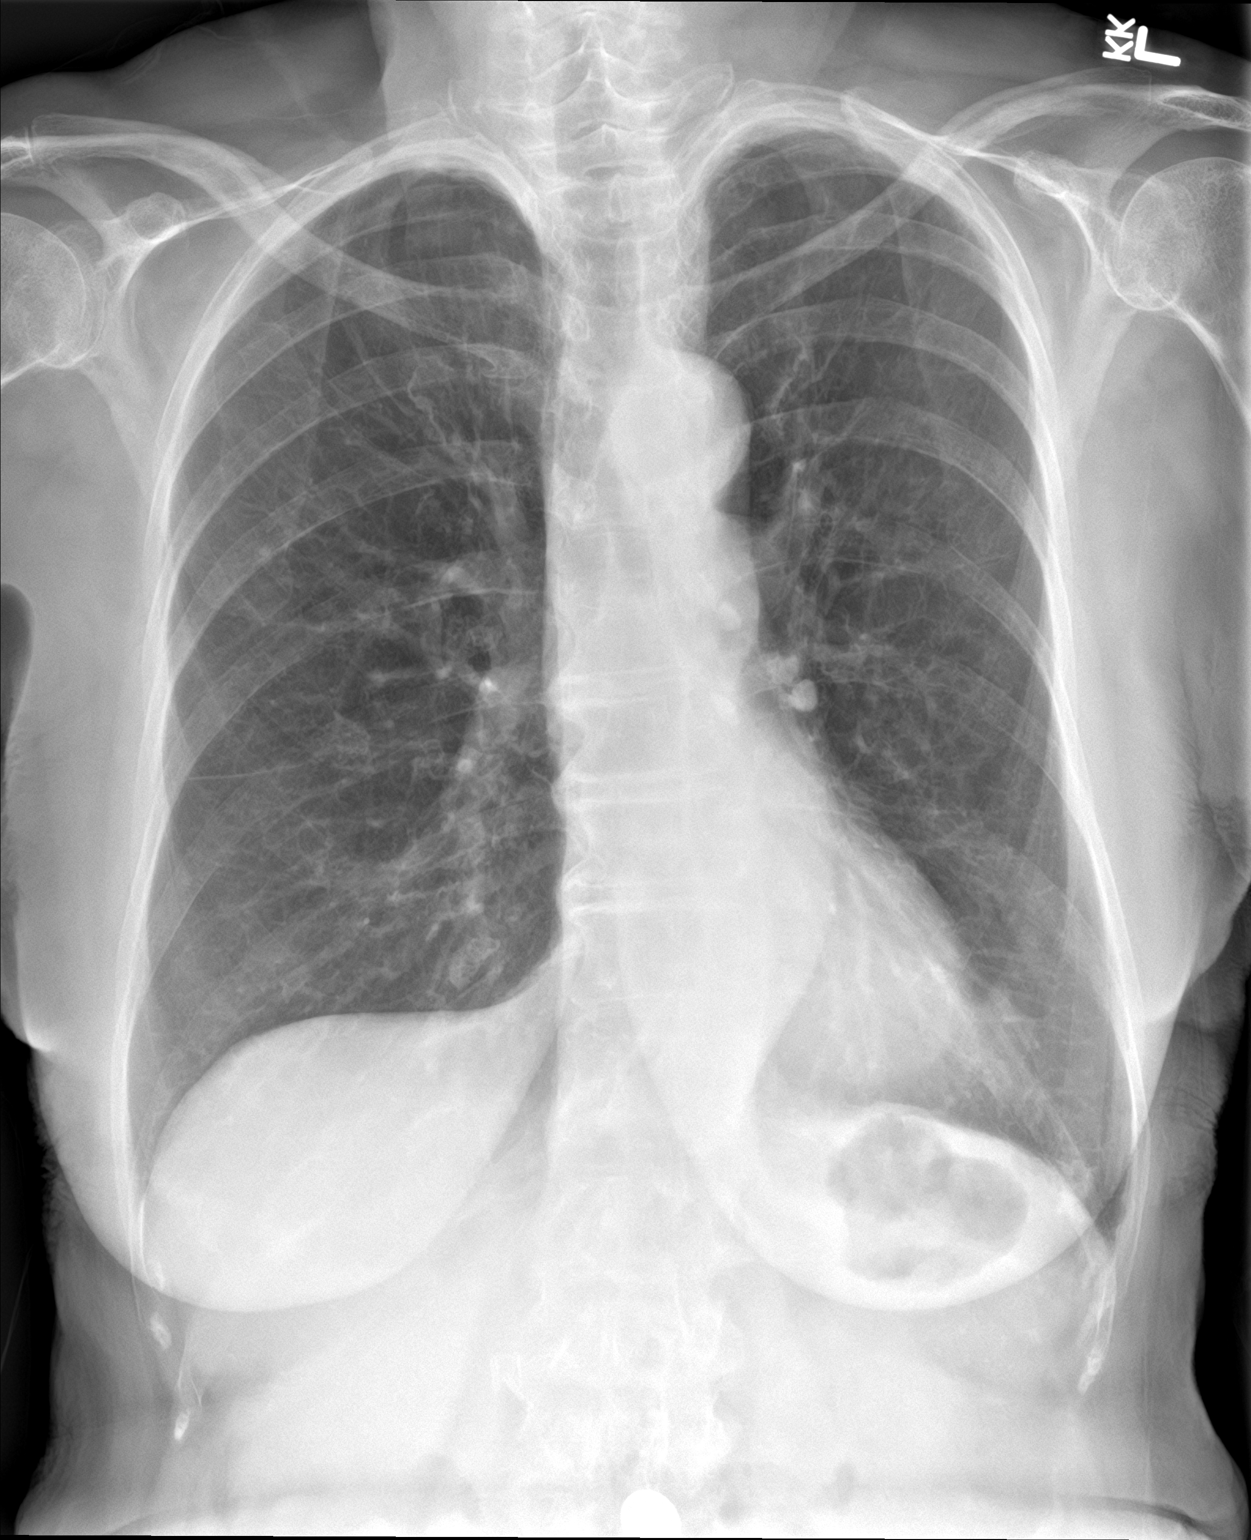

[rib pa]
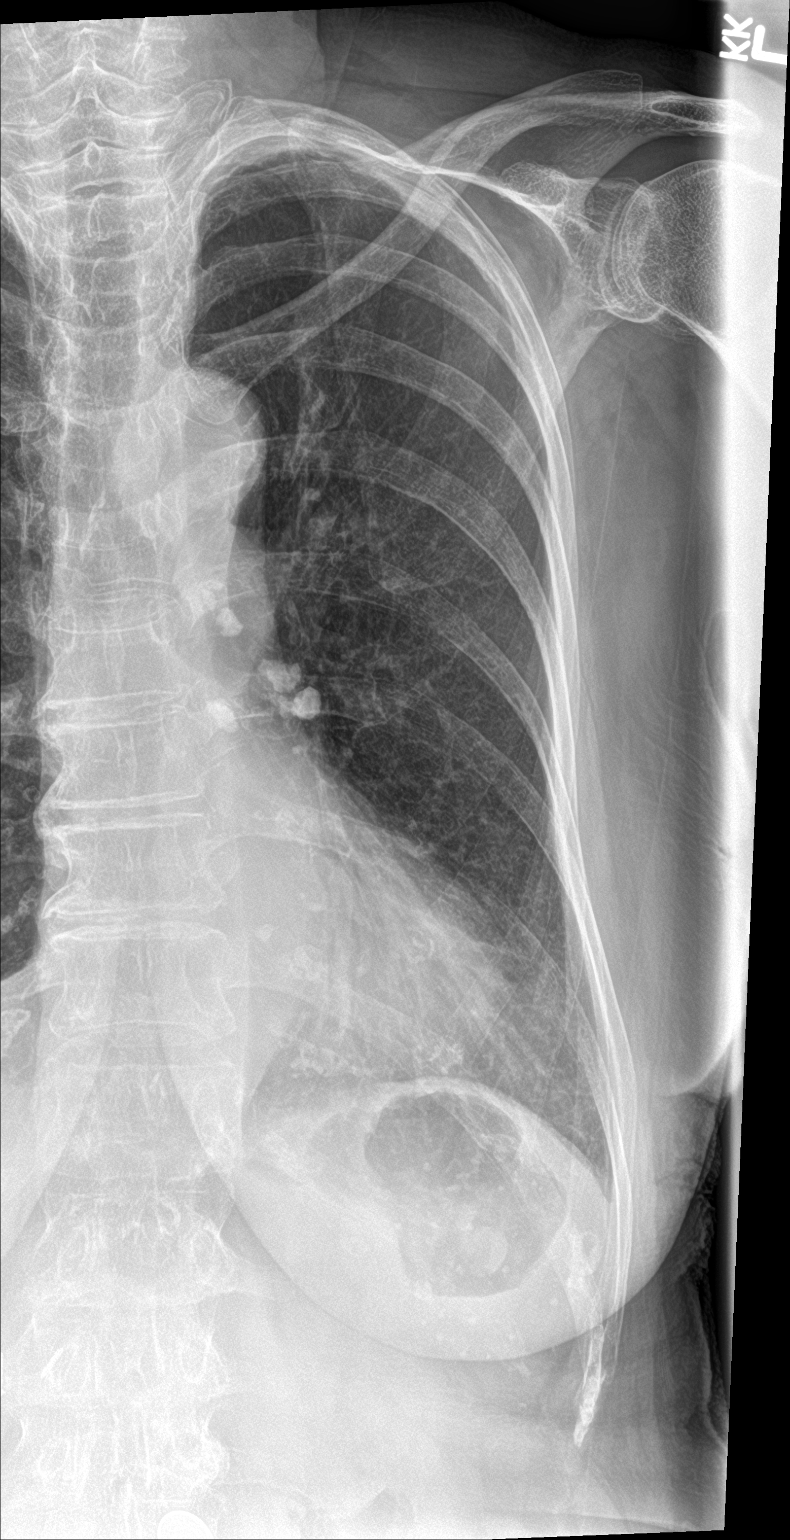

[rib pa obl]
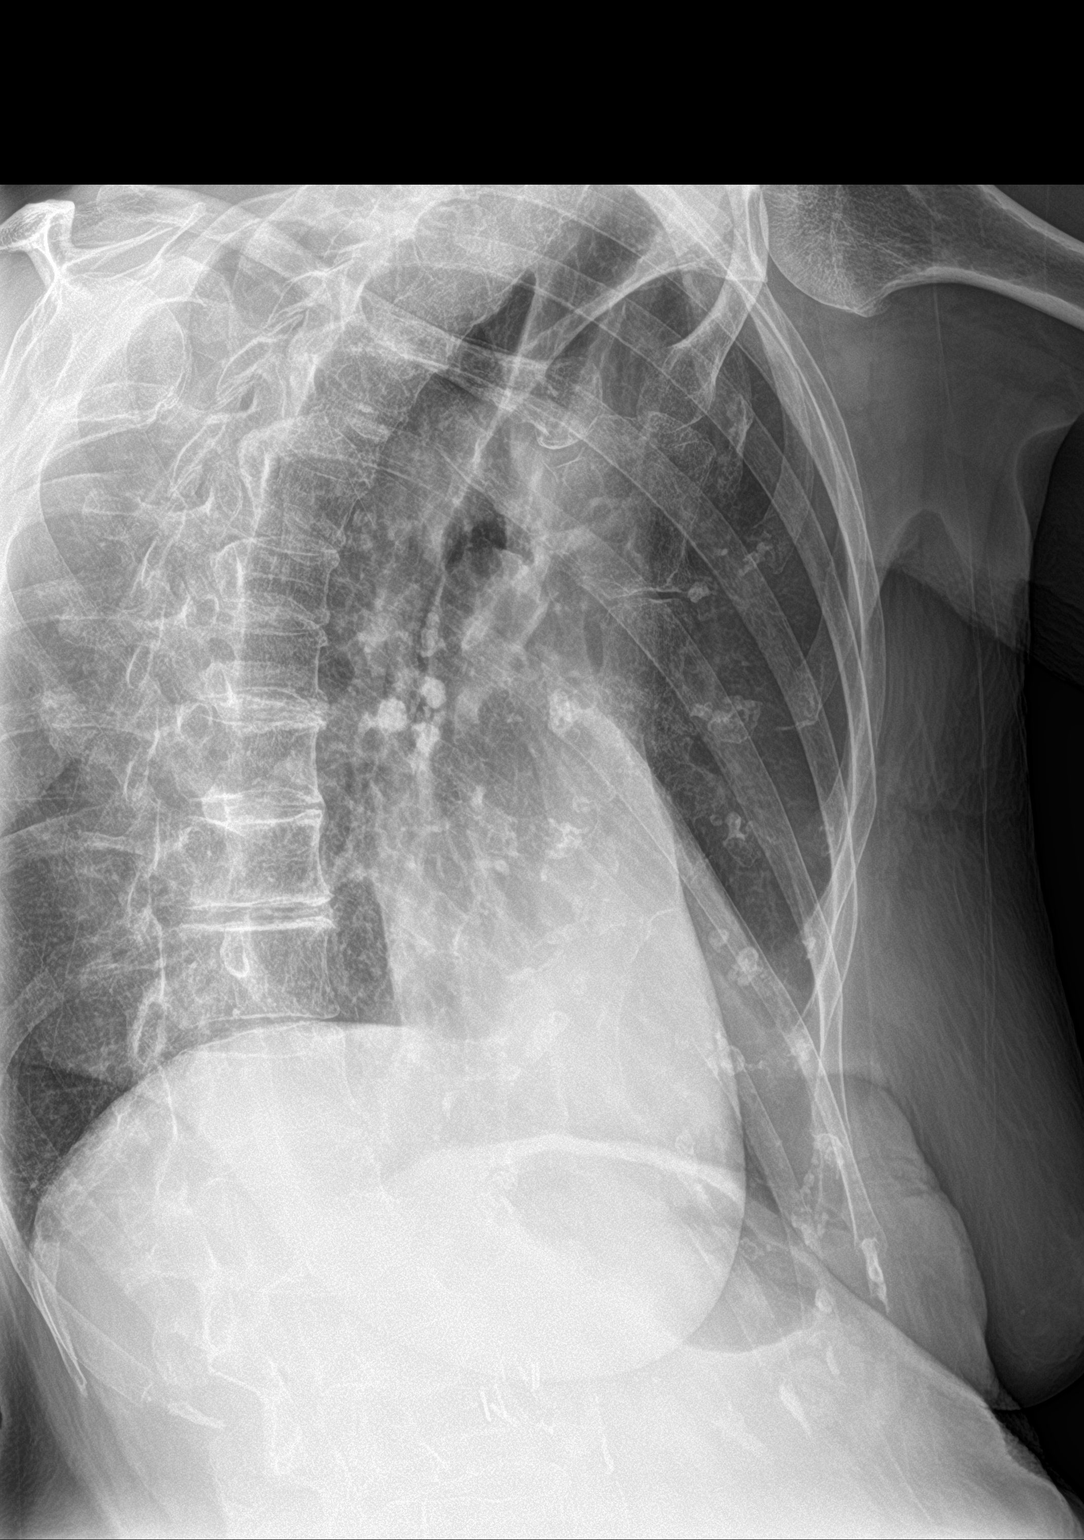

[3 of 3 positions shown; findings below may reference images not displayed]

FINDINGS: Normal heart size. No pleural effusion or edema. Lungs are
hyperinflated with mild chronic interstitial coarsening. No
superimposed airspace consolidation, atelectasis, or pneumothorax.
No displaced rib fractures identified.
IMPRESSION: 1. No acute cardiopulmonary abnormalities.
2. No displaced rib fractures identified.

## 2023-04-02 ENCOUNTER — Emergency Department
Admission: EM | Admit: 2023-04-02 | Discharge: 2023-04-02 | Disposition: A | Discharge status: Home / Self Care | Payer: Medicare Other | Attending: Emergency Medicine | Admitting: Emergency Medicine

## 2023-04-02 ENCOUNTER — Other Ambulatory Visit: Payer: Self-pay

## 2023-04-02 DIAGNOSIS — R200 Anesthesia of skin: Secondary | ICD-10-CM | POA: Diagnosis present

## 2023-04-02 DIAGNOSIS — I10 Essential (primary) hypertension: Secondary | ICD-10-CM | POA: Diagnosis not present

## 2023-04-02 DIAGNOSIS — R000 Tachycardia, unspecified: Secondary | ICD-10-CM | POA: Diagnosis not present

## 2023-04-02 LAB — CBC WITH DIFFERENTIAL/PLATELET
Abs Immature Granulocytes: 0.02 10*3/uL (ref 0.00–0.07)
Basophils Absolute: 0 10*3/uL (ref 0.0–0.1)
Basophils Relative: 1 %
Eosinophils Absolute: 0.3 10*3/uL (ref 0.0–0.5)
Eosinophils Relative: 4 %
HCT: 40.8 % (ref 36.0–46.0)
Hemoglobin: 13.3 g/dL (ref 12.0–15.0)
Immature Granulocytes: 0 %
Lymphocytes Relative: 24 %
Lymphs Abs: 1.4 10*3/uL (ref 0.7–4.0)
MCH: 31.7 pg (ref 26.0–34.0)
MCHC: 32.6 g/dL (ref 30.0–36.0)
MCV: 97.1 fL (ref 80.0–100.0)
Monocytes Absolute: 0.3 10*3/uL (ref 0.1–1.0)
Monocytes Relative: 6 %
Neutro Abs: 3.9 10*3/uL (ref 1.7–7.7)
Neutrophils Relative %: 65 %
Platelets: 230 10*3/uL (ref 150–400)
RBC: 4.2 MIL/uL (ref 3.87–5.11)
RDW: 12.4 % (ref 11.5–15.5)
WBC: 6 10*3/uL (ref 4.0–10.5)
nRBC: 0 % (ref 0.0–0.2)

## 2023-04-02 LAB — COMPREHENSIVE METABOLIC PANEL
ALT: 15 U/L (ref 0–44)
AST: 24 U/L (ref 15–41)
Albumin: 4.6 g/dL (ref 3.5–5.0)
Alkaline Phosphatase: 44 U/L (ref 38–126)
Anion gap: 14 (ref 5–15)
BUN: 26 mg/dL — ABNORMAL HIGH (ref 8–23)
CO2: 21 mmol/L — ABNORMAL LOW (ref 22–32)
Calcium: 9.1 mg/dL (ref 8.9–10.3)
Chloride: 104 mmol/L (ref 98–111)
Creatinine, Ser: 1.17 mg/dL — ABNORMAL HIGH (ref 0.44–1.00)
GFR, Estimated: 45 mL/min — ABNORMAL LOW (ref >60)
Glucose, Bld: 119 mg/dL — ABNORMAL HIGH (ref 70–99)
Potassium: 4 mmol/L (ref 3.5–5.1)
Sodium: 139 mmol/L (ref 135–145)
Total Bilirubin: 0.8 mg/dL (ref 0.3–1.2)
Total Protein: 7.6 g/dL (ref 6.5–8.1)

## 2023-04-02 LAB — LACTIC ACID, PLASMA: Lactic Acid, Venous: 1.2 mmol/L (ref 0.5–1.9)

## 2023-04-02 NOTE — Discharge Instructions (Signed)
Your numbness is most likely due to peripheral neuropathy.  There are no signs of any acute problem with your circulation currently.  You should follow-up with Dr. Judithann Sheen.  Return to the ER immediately for new, worsening, or persistent severe numbness, weakness, difficulty walking or bearing weight on the legs, acute pain, discoloration, or any other new or worsening symptoms that concern you.

## 2023-04-02 NOTE — ED Triage Notes (Signed)
Pt presents to ED with c/o of bilateral foot numbness. Caregiver at home worried about discoloration lateral side of R great toe. Leg and foot is warm to touch, R pedal pulse noted.

## 2023-04-02 NOTE — ED Provider Notes (Signed)
[facility] Provider Note    Event Date/Time   First MD Initiated Contact with Patient 04/02/23 1227     (approximate)   History   Numbness   HPI  Carla Warner is a 89 y.o. female with a history of hypertension, hyperlipidemia, arthritis, IBS, and GERD who presents with bilateral leg numbness and some discoloration to her right great toe.  The patient states that she has had bilateral numbness to the lower legs for at least several weeks.  It is perhaps slightly worse over the last 4 to 5 days.  It is symmetrical.  The patient denies any acute pain.  She denies any trauma or injury.  She is still able to move the legs and has normal strength.  Today her home nurse noted that her right great toe appeared discolored and when she pressed on it, the circulation appeared to come back more slowly.  However the patient denies any acute symptoms to this toe at this time.  I reviewed the past medical records per the patient was most recently seen by her PMD Dr. Judithann Sheen on 4/24 for routine follow-up of her chronic conditions.   Physical Exam   Triage Vital Signs: ED Triage Vitals [04/02/23 1150]  Enc Vitals Group     BP (!) 160/95     Pulse Rate (!) 113     Resp 17     Temp 97.9 F (36.6 C)     Temp Source Oral     SpO2 96 %     Weight      Height      Head Circumference      Peak Flow      Pain Score      Pain Loc      Pain Edu?      Excl. in GC?     Most recent vital signs: Vitals:   04/02/23 1150  BP: (!) 160/95  Pulse: (!) 113  Resp: 17  Temp: 97.9 F (36.6 C)  SpO2: 96%     General: Awake, no distress.  CV:  Good peripheral perfusion.  Resp:  Normal effort.  Abd:  No distention.  Other:  Bilateral lower extremities with normal color, 2+ DP pulse to the left, 1+ on the right but easily audible on Doppler.  Normal cap refill bilaterally.  Normal and symmetrical temperature to palpation.  5/5 motor strength proximally and distally to  bilateral lower extremities.  Subjective decreased sensation from approximately 10 cm below the knee to the feet.   ED Results / Procedures / Treatments   Labs (all labs ordered are listed, but only abnormal results are displayed) Labs Reviewed  COMPREHENSIVE METABOLIC PANEL - Abnormal; Notable for the following components:      Result Value   CO2 21 (*)    Glucose, Bld 119 (*)    BUN 26 (*)    Creatinine, Ser 1.17 (*)    GFR, Estimated 45 (*)    All other components within normal limits  CBC WITH DIFFERENTIAL/PLATELET  LACTIC ACID, PLASMA     EKG  ED ECG REPORT I, Dionne Bucy, the attending physician, personally viewed and interpreted this ECG.  Date: 04/02/2023 EKG Time: 1348 Rate: 88 Rhythm: normal sinus rhythm QRS Axis: normal Intervals: normal ST/T Wave abnormalities: Nonspecific ST abnormalities Narrative Interpretation: Nonspecific abnormalities with no evidence of acute ischemia; no significant change when compared to EKG of 07/09/2021   RADIOLOGY    PROCEDURES:  Critical Care performed:  No  Procedures   MEDICATIONS ORDERED IN ED: Medications - No data to display   IMPRESSION / MDM / ASSESSMENT AND PLAN / ED COURSE  I reviewed the triage vital signs and the nursing notes.  89 year old female with PMH as noted above presents with numbness to bilateral feet and lower legs which is subacute as well as some transient discoloration to the right great toe which has now resolved.  Currently the physical exam is unremarkable except that the patient is tachycardic.  I see that on prior outpatient encounters and ED visits her heart rate is usually around 105 so this is only minor increase.  The patient endorses some anxiety around medical visits.  She denies any palpitations.  Differential diagnosis includes, but is not limited to, peripheral neuropathy, peripheral vascular disease.  There is no evidence of any acute arterial occlusion or embolus.  There is  no evidence of acute stroke or other CNS cause, or of a radiculopathy given the time course, and symmetric involvement of both sides.  Will obtain basic labs, lactate due to the tachycardia, EKG, and reassess.  Patient's presentation is most consistent with acute complicated illness / injury requiring diagnostic workup.  The patient is on the cardiac monitor to evaluate for evidence of arrhythmia and/or significant heart rate changes.   ----------------------------------------- 2:14 PM on 04/02/2023 -----------------------------------------  Lactate is normal.  EKG shows no acute findings.  The heart rate has come down to the 80s without intervention.  Overall presentation is consistent with peripheral neuropathy or other benign subacute to chronic etiology.  There is no evidence of vascular etiology.  The patient is stable for discharge home with outpatient follow-up.  I counseled her and her son on the results of the workup and plan of care.  I gave strict return precautions and they expressed understanding.  FINAL CLINICAL IMPRESSION(S) / ED DIAGNOSES   Final diagnoses:  Numbness     Rx / DC Orders   ED Discharge Orders     None        Note:  This document was prepared using Dragon voice recognition software and may include unintentional dictation errors.    Dionne Bucy, MD 04/02/23 1415
